# Patient Record
Sex: Female | Born: 1960
Health system: Southern US, Community
[De-identification: ages and names within clinical notes are randomized; demographics above are authoritative.]

## PROBLEM LIST (undated history)

## (undated) DIAGNOSIS — J449 Chronic obstructive pulmonary disease, unspecified: Secondary | ICD-10-CM

## (undated) DIAGNOSIS — J189 Pneumonia, unspecified organism: Secondary | ICD-10-CM

## (undated) HISTORY — PX: CHOLECYSTECTOMY: SHX55

## (undated) HISTORY — DX: Chronic obstructive pulmonary disease, unspecified: J44.9

---

## 2006-11-09 ENCOUNTER — Other Ambulatory Visit: Payer: Self-pay

## 2006-11-09 ENCOUNTER — Emergency Department: Payer: Self-pay | Admitting: Emergency Medicine

## 2013-02-13 ENCOUNTER — Ambulatory Visit: Payer: Self-pay | Admitting: Nurse Practitioner

## 2014-05-15 DIAGNOSIS — J189 Pneumonia, unspecified organism: Secondary | ICD-10-CM

## 2014-05-15 HISTORY — DX: Pneumonia, unspecified organism: J18.9

## 2014-06-01 ENCOUNTER — Ambulatory Visit: Payer: Self-pay | Admitting: Family Medicine

## 2014-06-08 ENCOUNTER — Ambulatory Visit: Payer: Self-pay | Admitting: Family Medicine

## 2014-08-13 ENCOUNTER — Ambulatory Visit
Admit: 2014-08-13 | Disposition: A | Discharge status: Home / Self Care | Payer: Self-pay | Attending: Family Medicine | Admitting: Family Medicine

## 2014-08-13 LAB — RAPID STREP-A WITH REFLX: Micro Text Report: NEGATIVE

## 2014-08-16 LAB — BETA STREP CULTURE(ARMC)

## 2015-04-23 ENCOUNTER — Emergency Department: Payer: BLUE CROSS/BLUE SHIELD

## 2015-04-23 ENCOUNTER — Encounter: Payer: Self-pay | Admitting: Emergency Medicine

## 2015-04-23 ENCOUNTER — Emergency Department
Admission: EM | Admit: 2015-04-23 | Discharge: 2015-04-23 | Disposition: A | Discharge status: Home / Self Care | Payer: BLUE CROSS/BLUE SHIELD | Attending: Emergency Medicine | Admitting: Emergency Medicine

## 2015-04-23 DIAGNOSIS — R1011 Right upper quadrant pain: Secondary | ICD-10-CM

## 2015-04-23 DIAGNOSIS — K8020 Calculus of gallbladder without cholecystitis without obstruction: Secondary | ICD-10-CM | POA: Diagnosis not present

## 2015-04-23 DIAGNOSIS — R109 Unspecified abdominal pain: Secondary | ICD-10-CM

## 2015-04-23 DIAGNOSIS — R1013 Epigastric pain: Secondary | ICD-10-CM | POA: Diagnosis present

## 2015-04-23 DIAGNOSIS — N390 Urinary tract infection, site not specified: Secondary | ICD-10-CM | POA: Diagnosis not present

## 2015-04-23 LAB — URINALYSIS COMPLETE WITH MICROSCOPIC (ARMC ONLY)
BILIRUBIN URINE: NEGATIVE
GLUCOSE, UA: NEGATIVE mg/dL
Ketones, ur: NEGATIVE mg/dL
NITRITE: NEGATIVE
Protein, ur: NEGATIVE mg/dL
Specific Gravity, Urine: 1.005 (ref 1.005–1.030)
pH: 8 (ref 5.0–8.0)

## 2015-04-23 LAB — COMPREHENSIVE METABOLIC PANEL
ALBUMIN: 4.3 g/dL (ref 3.5–5.0)
ALT: 26 U/L (ref 14–54)
AST: 22 U/L (ref 15–41)
Alkaline Phosphatase: 50 U/L (ref 38–126)
Anion gap: 7 (ref 5–15)
BILIRUBIN TOTAL: 0.4 mg/dL (ref 0.3–1.2)
BUN: 12 mg/dL (ref 6–20)
CO2: 33 mmol/L — ABNORMAL HIGH (ref 22–32)
Calcium: 9.8 mg/dL (ref 8.9–10.3)
Chloride: 102 mmol/L (ref 101–111)
Creatinine, Ser: 0.62 mg/dL (ref 0.44–1.00)
GFR calc Af Amer: >60 mL/min (ref >60)
GFR calc non Af Amer: >60 mL/min (ref >60)
GLUCOSE: 92 mg/dL (ref 65–99)
POTASSIUM: 4 mmol/L (ref 3.5–5.1)
Sodium: 142 mmol/L (ref 135–145)
Total Protein: 8.2 g/dL — ABNORMAL HIGH (ref 6.5–8.1)

## 2015-04-23 LAB — CBC
HEMATOCRIT: 41.6 % (ref 35.0–47.0)
Hemoglobin: 13.6 g/dL (ref 12.0–16.0)
MCH: 29.8 pg (ref 26.0–34.0)
MCHC: 32.7 g/dL (ref 32.0–36.0)
MCV: 91.2 fL (ref 80.0–100.0)
Platelets: 330 10*3/uL (ref 150–440)
RBC: 4.56 MIL/uL (ref 3.80–5.20)
RDW: 14.4 % (ref 11.5–14.5)
WBC: 8.1 10*3/uL (ref 3.6–11.0)

## 2015-04-23 LAB — TROPONIN I
Troponin I: <0.03 ng/mL (ref <0.031)
Troponin I: <0.03 ng/mL (ref <0.031)

## 2015-04-23 LAB — LIPASE, BLOOD: Lipase: 44 U/L (ref 11–51)

## 2015-04-23 MED ORDER — IOHEXOL 350 MG/ML SOLN
100.0000 mL | Freq: Once | INTRAVENOUS | Status: AC | PRN
  Administered 2015-04-23: 100 mL via INTRAVENOUS

## 2015-04-23 MED ORDER — FAMOTIDINE 20 MG PO TABS
40.0000 mg | ORAL_TABLET | Freq: Once | ORAL | Status: AC
  Administered 2015-04-23: 40 mg via ORAL
  Filled 2015-04-23: qty 2

## 2015-04-23 MED ORDER — MORPHINE SULFATE (PF) 4 MG/ML IV SOLN
4.0000 mg | Freq: Once | INTRAVENOUS | Status: AC
  Administered 2015-04-23: 4 mg via INTRAVENOUS
  Filled 2015-04-23: qty 1

## 2015-04-23 MED ORDER — ONDANSETRON HCL 4 MG/2ML IJ SOLN
4.0000 mg | Freq: Once | INTRAMUSCULAR | Status: AC
  Administered 2015-04-23: 4 mg via INTRAVENOUS
  Filled 2015-04-23: qty 2

## 2015-04-23 MED ORDER — CEPHALEXIN 500 MG PO CAPS
500.0000 mg | ORAL_CAPSULE | Freq: Once | ORAL | Status: AC
Start: 2015-04-23 — End: 2015-04-23
  Administered 2015-04-23: 500 mg via ORAL
  Filled 2015-04-23: qty 1

## 2015-04-23 MED ORDER — SODIUM CHLORIDE 0.9 % IV BOLUS (SEPSIS)
1000.0000 mL | Freq: Once | INTRAVENOUS | Status: AC
  Administered 2015-04-23: 1000 mL via INTRAVENOUS

## 2015-04-23 MED ORDER — FAMOTIDINE 40 MG PO TABS: 40.0000 mg | ORAL_TABLET | Freq: Every evening | ORAL | Status: DC

## 2015-04-23 MED ORDER — OXYCODONE-ACETAMINOPHEN 5-325 MG PO TABS
1.0000 | ORAL_TABLET | Freq: Once | ORAL | Status: AC
  Administered 2015-04-23: 1 via ORAL
  Filled 2015-04-23: qty 1

## 2015-04-23 MED ORDER — OXYCODONE-ACETAMINOPHEN 5-325 MG PO TABS: 1.0000 | ORAL_TABLET | Freq: Four times a day (QID) | ORAL | Status: DC | PRN

## 2015-04-23 MED ORDER — ONDANSETRON HCL 4 MG PO TABS: 4.0000 mg | ORAL_TABLET | Freq: Every day | ORAL | Status: DC | PRN

## 2015-04-23 MED ORDER — GI COCKTAIL ~~LOC~~
30.0000 mL | Freq: Once | ORAL | Status: AC
  Administered 2015-04-23: 30 mL via ORAL
  Filled 2015-04-23: qty 30

## 2015-04-23 MED ORDER — CEPHALEXIN 500 MG PO CAPS: 500.0000 mg | ORAL_CAPSULE | Freq: Four times a day (QID) | ORAL | Status: AC

## 2015-04-23 NOTE — ED Notes (Signed)
Pt w/ complaints of being dizzy/light headed upon ambulation to toilet.

## 2015-04-23 NOTE — ED Notes (Addendum)
Patient transported to Ultrasound 

## 2015-04-23 NOTE — ED Provider Notes (Signed)
[facility] Emergency Department Provider Note  ____________________________________________  Time seen: Approximately 330 PM  I have reviewed the triage vital signs and the nursing notes.   HISTORY  Chief Complaint Abdominal Pain    HPI Wanda Goodman is a 54 y.o. female with a history of a C-section who is presenting today with 2 weeks of increasing epigastric abdominal pain. She says that today the pain began radiating to her back and this was came in the emergency department. She says there are no worsening factors including food or deep breathing. She says that she has had nausea but no vomiting. Has a family history of gallbladder issues. No diarrhea. No pain with urination. Says the pain has been on and off. It is moderate to severe at this time.    History reviewed. No pertinent past medical history.  There are no active problems to display for this patient.   Past Surgical History  Procedure Laterality Date  . Cesarean section      No current outpatient prescriptions on file.  Allergies Review of patient's allergies indicates no known allergies.  No family history on file.  Social History Social History  Substance Use Topics  . Smoking status: Never Smoker   . Smokeless tobacco: None  . Alcohol Use: No    Review of Systems Constitutional: No fever/chills Eyes: No visual changes. ENT: No sore throat. Cardiovascular: Denies chest pain. Respiratory: Denies shortness of breath. Gastrointestinal:no vomiting.  No diarrhea.  No constipation. Genitourinary: Negative for dysuria. Musculoskeletal: Negative for back pain. Skin: Negative for rash. Neurological: Negative for headaches, focal weakness or numbness.  10-point ROS otherwise negative.  ____________________________________________   PHYSICAL EXAM:  VITAL SIGNS: ED Triage Vitals  Enc Vitals Group     BP 04/23/15 1350 139/82 mmHg     Pulse Rate 04/23/15 1350 78      Resp 04/23/15 1350 18     Temp 04/23/15 1350 98 F (36.7 C)     Temp Source 04/23/15 1350 Oral     SpO2 04/23/15 1350 98 %     Weight 04/23/15 1350 172 lb (78.019 kg)     Height 04/23/15 1350 5' (1.524 m)     Head Cir --      Peak Flow --      Pain Score 04/23/15 1406 3     Pain Loc --      Pain Edu? --      Excl. in [place]? --     Constitutional: Alert and oriented. Appears uncomfortable.  Eyes: Conjunctivae are normal. PERRL. EOMI. Head: Atraumatic. Nose: No congestion/rhinnorhea. Mouth/Throat: Mucous membranes are moist.  Oropharynx non-erythematous. Neck: No stridor.   Cardiovascular: Normal rate, regular rhythm. Grossly normal heart sounds.  Good peripheral circulation. Respiratory: Normal respiratory effort.  No retractions. Lungs CTAB. Gastrointestinal: Soft with moderate epigastric as well as right upper quadrant tenderness palpation. There is a negative Murphy sign. No distention. No abdominal bruits. No CVA tenderness. Musculoskeletal: No lower extremity tenderness nor edema.  No joint effusions. Neurologic:  Normal speech and language. No gross focal neurologic deficits are appreciated. No gait instability. Skin:  Skin is warm, dry and intact. No rash noted. Psychiatric: Mood and affect are normal. Speech and behavior are normal.  ____________________________________________   LABS (all labs ordered are listed, but only abnormal results are displayed)  Labs Reviewed  COMPREHENSIVE METABOLIC PANEL - Abnormal; Notable for the following:    CO2 33 (*)    Total Protein 8.2 (*)  All other components within normal limits  URINALYSIS COMPLETEWITH MICROSCOPIC ([facility] ONLY) - Abnormal; Notable for the following:    Color, Urine STRAW (*)    APPearance HAZY (*)    Hgb urine dipstick 1+ (*)    Leukocytes, UA 3+ (*)    Bacteria, UA RARE (*)    Squamous Epithelial / LPF 6-30 (*)    All other components within normal limits  URINE CULTURE  LIPASE, BLOOD  CBC  TROPONIN I   TROPONIN I   ____________________________________________  EKG  ED ECG REPORT I, Doran Stabler, the attending physician, personally viewed and interpreted this ECG.   Date: 04/23/2015  EKG Time: 1417  Rate: 74  Rhythm: normal EKG, normal sinus rhythm  Axis: Normal  Intervals:none  ST&T Change: No ST elevations or depressions. No abnormal T-wave inversions.  ____________________________________________  RADIOLOGY  No acute findings on the CT angiography. I discussed the case with Dr. Purcell Nails who says that the stenosis is likely anatomical and unlikely causing any symptoms at this time. Ultrasound does show cholelithiasis. ____________________________________________   PROCEDURES    ____________________________________________   INITIAL IMPRESSION / ASSESSMENT AND PLAN / ED COURSE  Pertinent labs & imaging results that were available during my care of the patient were reviewed by me and considered in my medical decision making (see chart for details).  ----------------------------------------- 8:59 PM on 04/23/2015 -----------------------------------------  Patient with pain subsided after pain meds as well as antacids. Reviewed lab workup as well as imaging results. The patient is aware of her cholelithiasis. Due to her pain now being under control as well as reassuring labs and vital signs she will be able to be discharged home. I will discharge her with by mouth medications. She will follow-up in the office with surgeon. ____________________________________________   FINAL CLINICAL IMPRESSION(S) / ED DIAGNOSES  Final diagnoses:  RUQ abdominal pain  Abdominal pain   cholelithiasis. UTI.    Orbie Pyo, MD 04/23/15 2104

## 2015-04-23 NOTE — ED Notes (Signed)
Pt in w/ intermittent abdominal pain since last Monday; pt reports nausea, denies vomiting/diarrhea.  Pt states sometimes the pain is so bad that its difficult to breathe.  Pt A/Ox4, vitals WDL, no immediate distress at this time.

## 2015-04-23 NOTE — ED Notes (Signed)
Patient transported to CT 

## 2015-04-23 NOTE — ED Notes (Signed)
Brought over from [facility] , epigastric abd pain x1 week , non radiating, pain feels like twisting motion.

## 2015-04-25 LAB — URINE CULTURE

## 2015-05-05 ENCOUNTER — Encounter: Payer: Self-pay | Admitting: General Surgery

## 2015-05-06 ENCOUNTER — Encounter: Payer: Self-pay | Admitting: General Surgery

## 2015-05-06 ENCOUNTER — Ambulatory Visit (INDEPENDENT_AMBULATORY_CARE_PROVIDER_SITE_OTHER): Payer: BLUE CROSS/BLUE SHIELD | Admitting: General Surgery

## 2015-05-06 VITALS — BP 151/85 | HR 67 | Temp 98.7°F | Ht 60.0 in | Wt 174.0 lb

## 2015-05-06 DIAGNOSIS — K8020 Calculus of gallbladder without cholecystitis without obstruction: Secondary | ICD-10-CM | POA: Diagnosis not present

## 2015-05-06 HISTORY — DX: Calculus of gallbladder without cholecystitis without obstruction: K80.20

## 2015-05-06 NOTE — Patient Instructions (Signed)
If you have any questions or concerns, please give [place] a call. We will do your surgery on May 25, 2015.

## 2015-05-06 NOTE — Progress Notes (Signed)
Patient ID: Wanda Goodman, female   DOB: Dec 31, 1960, 54 y.o.   MRN: [id number]  CC: ABDOMINAL PAIN  HPI Wanda Goodman is a 54 y.o. female presents clinic for evaluation of abdominal pain. Patient states she's had multiple bouts of abdominal pain over the last 3 years. She's been seen in the emergency department for this 2 separate times at 2 separate locations during this time. She states that on each separate episode that led to the emergency room visits she had severe midepigastric and right upper quadrant pain that shot through to her back on the right side. She denies any associated nausea, vomiting, diarrhea, constipation. She has chronic shortness of breath and intermittent chest pain secondary to COPD. Patient states his most recent attack lasted for about 2 weeks prior to being seen in the emergency department but has subsided since. She is otherwise in her usual state of health without any other current complaints.  HPI  Past Medical History  Diagnosis Date  . COPD (chronic obstructive pulmonary disease) [facility])     Past Surgical History  Procedure Laterality Date  . Cesarean section      Family History  Problem Relation Age of Onset  . Hypertension Mother   . COPD Mother   . Heart disease Mother   . COPD Brother     Social History Social History  Substance Use Topics  . Smoking status: Never Smoker   . Smokeless tobacco: None  . Alcohol Use: No    No Known Allergies  Current Outpatient Prescriptions  Medication Sig Dispense Refill  . albuterol (PROAIR HFA) 108 (90 BASE) MCG/ACT inhaler Inhale 2 puffs into the lungs.    . ondansetron (ZOFRAN) 4 MG tablet Take 1 tablet (4 mg total) by mouth daily as needed for nausea or vomiting. 10 tablet 0  . oxyCODONE-acetaminophen (ROXICET) 5-325 MG tablet Take 1-2 tablets by mouth every 6 (six) hours as needed. 15 tablet 0   No current facility-administered medications for this visit.     Review of Systems A  multi-point review of systems was asked and was negative except for the findings documented in the history of present illness  Physical Exam Blood pressure 151/85, pulse 67, temperature 98.7 F (37.1 C), height 5' (1.524 m), weight 78.926 kg (174 lb). CONSTITUTIONAL: No acute distress. EYES: Pupils are equal, round, and reactive to light, Sclera are non-icteric. EARS, NOSE, MOUTH AND THROAT: The oropharynx is clear. The oral mucosa is pink and moist. Hearing is intact to voice. LYMPH NODES:  Lymph nodes in the neck are normal. RESPIRATORY:  Lungs are with coarse breath sounds in all quadrants. There is normal respiratory effort, with equal breath sounds bilaterally, and without pathologic use of accessory muscles. CARDIOVASCULAR: Heart is regular without murmurs, gallops, or rubs. GI: The abdomen is soft, nontender, and nondistended. There are no palpable masses. There is no hepatosplenomegaly. There are normal bowel sounds in all quadrants. GU: Rectal deferred.   MUSCULOSKELETAL: Normal muscle strength and tone. No cyanosis or edema.   SKIN: Turgor is good and there are no pathologic skin lesions or ulcers. NEUROLOGIC: Motor and sensation is grossly normal. Cranial nerves are grossly intact. PSYCH:  Oriented to person, place and time. Affect is normal.  Data Reviewed Images and labs reviewed. Ultrasound shows evidence of cholelithiasis without cholecystitis. All labs are within normal limits. I have personally reviewed the patient's imaging, laboratory findings and medical records.    Assessment    Biliary colic  Plan    Discussed in detail with the patient that her symptoms and history are not 100% classic for biliary colic. Patient states that numerous members of her family have the exact same problem that was all related to their gallbladders. Discussed that this could also be from her stomach and that removing her gallbladder would not fix this patient voiced understanding and  still wishes to proceed with a laparoscopic cholecystectomy.  I discussed the procedure in detail.  The patient was given Neurosurgeon.  We discussed the risks and benefits of a laparoscopic cholecystectomy and possible cholangiogram including, but not limited to bleeding, infection, injury to surrounding structures such as the intestine or liver, bile leak, retained gallstones, need to convert to an open procedure, prolonged diarrhea, blood clots such as  DVT, common bile duct injury, anesthesia risks, and possible need for additional procedures.  The likelihood of improvement in symptoms and return to the patient's normal status is good. We discussed the typical post-operative recovery course. Plan for laparoscopic cholecystectomy on 05/25/2015     Time spent with the patient was 45 minutes, with more than 50% of the time spent in face-to-face education, counseling and care coordination.     Clayburn Pert, MD FACS General Surgeon 05/06/2015, 10:25 AM

## 2015-05-12 ENCOUNTER — Telehealth: Payer: Self-pay | Admitting: General Surgery

## 2015-05-12 NOTE — Telephone Encounter (Signed)
Pt advised of pre op date/time and sx date. Sx: 05/25/15 with Dr Gabrielle Dare Cholecystectomy.  Pre op: 05/14/15 between 1-5:00pm--phone.

## 2015-05-14 ENCOUNTER — Encounter: Payer: Self-pay | Admitting: *Deleted

## 2015-05-14 ENCOUNTER — Other Ambulatory Visit: Payer: BLUE CROSS/BLUE SHIELD

## 2015-05-14 NOTE — Patient Instructions (Signed)
  Your procedure is scheduled on: 05-25-15 Report to [place] To find out your arrival time please call [phone] between 1PM - 3PM on 05-24-15  Remember: Instructions that are not followed completely may result in serious medical risk, up to and including death, or upon the discretion of your surgeon and anesthesiologist your surgery may need to be rescheduled.    _X___ 1. Do not eat food or drink liquids after midnight. No gum chewing or hard candies.     _X___ 2. No Alcohol for 24 hours before or after surgery.   ____ 3. Bring all medications with you on the day of surgery if instructed.    ____ 4. Notify your doctor if there is any change in your medical condition     (cold, fever, infections).     Do not wear jewelry, make-up, hairpins, clips or nail polish.  Do not wear lotions, powders, or perfumes. You may wear deodorant.  Do not shave 48 hours prior to surgery. Men may shave face and neck.  Do not bring valuables to the hospital.    [facility] is not responsible for any belongings or valuables.               Contacts, dentures or bridgework may not be worn into surgery.  Leave your suitcase in the car. After surgery it may be brought to your room.  For patients admitted to the hospital, discharge time is determined by your treatment team.   Patients discharged the day of surgery will not be allowed to drive home.   Please read over the following fact sheets that you were given:      ____ Take these medicines the morning of surgery with A SIP OF WATER:    1. NONE  2.   3.   4.  5.  6.  ____ Fleet Enema (as directed)   ____ Use CHG Soap as directed  _X___ Use inhalers on the day of surgery-USE ALBUTEROL INHALER AT [place]  ____ Stop metformin 2 days prior to surgery    ____ Take 1/2 of usual insulin dose the night before surgery and none on the morning of surgery.   ____ Stop  Coumadin/Plavix/aspirin-N/A  ____ Stop Anti-inflammatories-NO NSAIDS OR ASA PRODCUTS-PERCOCET OK TO CONTINUE/TYLENOL OK   ____ Stop supplements until after surgery.    ____ Bring C-Pap to the hospital.

## 2015-05-18 MED ORDER — ETOMIDATE 2 MG/ML IV SOLN
INTRAVENOUS | Status: AC
  Filled 2015-05-18: qty 10

## 2015-05-19 ENCOUNTER — Telehealth: Payer: Self-pay | Admitting: General Surgery

## 2015-05-19 NOTE — Telephone Encounter (Signed)
No authorization is required for CPT: 47562--Laparoscopic cholecystectomy for Date of Service: 05/25/15 per Gaynelle Arabian with [employer].

## 2015-05-25 ENCOUNTER — Ambulatory Visit: Payer: BLUE CROSS/BLUE SHIELD | Admitting: Certified Registered Nurse Anesthetist

## 2015-05-25 ENCOUNTER — Ambulatory Visit
Admission: RE | Admit: 2015-05-25 | Discharge: 2015-05-25 | Disposition: A | Discharge status: Home / Self Care | Payer: BLUE CROSS/BLUE SHIELD | Source: Ambulatory Visit | Attending: General Surgery | Admitting: General Surgery

## 2015-05-25 ENCOUNTER — Encounter: Payer: Self-pay | Admitting: *Deleted

## 2015-05-25 ENCOUNTER — Encounter
Admission: RE | Disposition: A | Discharge status: Home / Self Care | Payer: Self-pay | Source: Ambulatory Visit | Attending: General Surgery

## 2015-05-25 DIAGNOSIS — K8080 Other cholelithiasis without obstruction: Secondary | ICD-10-CM | POA: Diagnosis not present

## 2015-05-25 DIAGNOSIS — J449 Chronic obstructive pulmonary disease, unspecified: Secondary | ICD-10-CM | POA: Insufficient documentation

## 2015-05-25 DIAGNOSIS — K8010 Calculus of gallbladder with chronic cholecystitis without obstruction: Secondary | ICD-10-CM | POA: Insufficient documentation

## 2015-05-25 DIAGNOSIS — K8020 Calculus of gallbladder without cholecystitis without obstruction: Secondary | ICD-10-CM | POA: Diagnosis present

## 2015-05-25 DIAGNOSIS — Z79899 Other long term (current) drug therapy: Secondary | ICD-10-CM | POA: Diagnosis not present

## 2015-05-25 DIAGNOSIS — Z01810 Encounter for preprocedural cardiovascular examination: Secondary | ICD-10-CM

## 2015-05-25 HISTORY — DX: Pneumonia, unspecified organism: J18.9

## 2015-05-25 HISTORY — PX: CHOLECYSTECTOMY: SHX55

## 2015-05-25 SURGERY — LAPAROSCOPIC CHOLECYSTECTOMY
Anesthesia: General

## 2015-05-25 MED ORDER — NEOSTIGMINE METHYLSULFATE 10 MG/10ML IV SOLN
INTRAVENOUS | Status: DC | PRN
  Administered 2015-05-25: 3 mg via INTRAVENOUS

## 2015-05-25 MED ORDER — SODIUM CHLORIDE 0.9 % IJ SOLN
INTRAMUSCULAR | Status: AC
  Filled 2015-05-25: qty 10

## 2015-05-25 MED ORDER — DEXAMETHASONE SODIUM PHOSPHATE 10 MG/ML IJ SOLN
INTRAMUSCULAR | Status: DC | PRN
  Administered 2015-05-25: 4 mg via INTRAVENOUS

## 2015-05-25 MED ORDER — ACETAMINOPHEN 10 MG/ML IV SOLN
INTRAVENOUS | Status: AC
  Filled 2015-05-25: qty 100

## 2015-05-25 MED ORDER — FENTANYL CITRATE (PF) 100 MCG/2ML IJ SOLN
INTRAMUSCULAR | Status: DC | PRN
  Administered 2015-05-25: 100 ug via INTRAVENOUS

## 2015-05-25 MED ORDER — LIDOCAINE HCL (CARDIAC) 20 MG/ML IV SOLN
INTRAVENOUS | Status: DC | PRN
  Administered 2015-05-25: 100 mg via INTRAVENOUS

## 2015-05-25 MED ORDER — PROPOFOL 10 MG/ML IV BOLUS
INTRAVENOUS | Status: DC | PRN
  Administered 2015-05-25: 200 mg via INTRAVENOUS

## 2015-05-25 MED ORDER — BUPIVACAINE HCL (PF) 0.5 % IJ SOLN
INTRAMUSCULAR | Status: AC
  Filled 2015-05-25: qty 30

## 2015-05-25 MED ORDER — CHLORHEXIDINE GLUCONATE 4 % EX LIQD: 1.0000 "application " | Freq: Once | CUTANEOUS | Status: DC

## 2015-05-25 MED ORDER — FENTANYL CITRATE (PF) 100 MCG/2ML IJ SOLN
25.0000 ug | INTRAMUSCULAR | Status: DC | PRN
  Administered 2015-05-25 (×3): 25 ug via INTRAVENOUS

## 2015-05-25 MED ORDER — BUPIVACAINE HCL 0.5 % IJ SOLN
INTRAMUSCULAR | Status: DC | PRN
  Administered 2015-05-25: 9 mL

## 2015-05-25 MED ORDER — PROMETHAZINE HCL 25 MG/ML IJ SOLN
6.2500 mg | INTRAMUSCULAR | Status: AC | PRN
  Administered 2015-05-25 (×2): 6.25 mg via INTRAVENOUS

## 2015-05-25 MED ORDER — PROMETHAZINE HCL 25 MG/ML IJ SOLN
INTRAMUSCULAR | Status: AC
  Administered 2015-05-25: 6.25 mg via INTRAVENOUS
  Filled 2015-05-25: qty 1

## 2015-05-25 MED ORDER — ONDANSETRON HCL 4 MG/2ML IJ SOLN
INTRAMUSCULAR | Status: AC
  Filled 2015-05-25: qty 2

## 2015-05-25 MED ORDER — SUCCINYLCHOLINE CHLORIDE 20 MG/ML IJ SOLN
INTRAMUSCULAR | Status: DC | PRN
  Administered 2015-05-25: 140 mg via INTRAVENOUS

## 2015-05-25 MED ORDER — GLYCOPYRROLATE 0.2 MG/ML IJ SOLN
INTRAMUSCULAR | Status: DC | PRN
  Administered 2015-05-25: 0.4 mg via INTRAVENOUS

## 2015-05-25 MED ORDER — LIDOCAINE HCL (PF) 1 % IJ SOLN
INTRAMUSCULAR | Status: DC | PRN
  Administered 2015-05-25: 9 mL

## 2015-05-25 MED ORDER — LACTATED RINGERS IV SOLN
INTRAVENOUS | Status: DC
  Administered 2015-05-25: 10:00:00 via INTRAVENOUS

## 2015-05-25 MED ORDER — FAMOTIDINE 20 MG PO TABS
20.0000 mg | ORAL_TABLET | Freq: Once | ORAL | Status: AC
  Administered 2015-05-25: 20 mg via ORAL

## 2015-05-25 MED ORDER — PHENYLEPHRINE HCL 10 MG/ML IJ SOLN
INTRAMUSCULAR | Status: DC | PRN
  Administered 2015-05-25 (×3): 100 ug via INTRAVENOUS

## 2015-05-25 MED ORDER — FAMOTIDINE 20 MG PO TABS
ORAL_TABLET | ORAL | Status: AC
  Filled 2015-05-25: qty 1

## 2015-05-25 MED ORDER — ONDANSETRON HCL 4 MG/2ML IJ SOLN
4.0000 mg | Freq: Once | INTRAMUSCULAR | Status: AC
  Administered 2015-05-25: 4 mg via INTRAVENOUS

## 2015-05-25 MED ORDER — MIDAZOLAM HCL 2 MG/2ML IJ SOLN
INTRAMUSCULAR | Status: DC | PRN
  Administered 2015-05-25: 2 mg via INTRAVENOUS

## 2015-05-25 MED ORDER — DEXTROSE 5 % IV SOLN
1.0000 g | Freq: Four times a day (QID) | INTRAVENOUS | Status: DC
  Administered 2015-05-25: 1 g via INTRAVENOUS
  Filled 2015-05-25: qty 1

## 2015-05-25 MED ORDER — LIDOCAINE HCL (PF) 1 % IJ SOLN
INTRAMUSCULAR | Status: AC
  Filled 2015-05-25: qty 30

## 2015-05-25 MED ORDER — OXYCODONE-ACETAMINOPHEN 5-325 MG PO TABS: 1.0000 | ORAL_TABLET | Freq: Four times a day (QID) | ORAL | Status: DC | PRN

## 2015-05-25 MED ORDER — ROCURONIUM BROMIDE 100 MG/10ML IV SOLN
INTRAVENOUS | Status: DC | PRN
  Administered 2015-05-25: 15 mg via INTRAVENOUS
  Administered 2015-05-25: 5 mg via INTRAVENOUS

## 2015-05-25 MED ORDER — FENTANYL CITRATE (PF) 100 MCG/2ML IJ SOLN
INTRAMUSCULAR | Status: AC
  Administered 2015-05-25: 25 ug via INTRAVENOUS
  Filled 2015-05-25: qty 2

## 2015-05-25 MED ORDER — ACETAMINOPHEN 10 MG/ML IV SOLN
INTRAVENOUS | Status: DC | PRN
  Administered 2015-05-25: 1000 mg via INTRAVENOUS

## 2015-05-25 SURGICAL SUPPLY — 49 items
ADHESIVE MASTISOL STRL (MISCELLANEOUS) IMPLANT
APPLIER CLIP ROT 10 11.4 M/L (STAPLE) ×3
BAG COUNTER SPONGE EZ (MISCELLANEOUS) ×2 IMPLANT
BENZOIN TINCTURE PRP APPL 2/3 (GAUZE/BANDAGES/DRESSINGS) ×3 IMPLANT
BLADE SURG SZ11 CARB STEEL (BLADE) ×3 IMPLANT
BULB RESERV EVAC DRAIN JP 100C (MISCELLANEOUS) IMPLANT
CANISTER SUCT 1200ML W/VALVE (MISCELLANEOUS) ×3 IMPLANT
CATH CHOLANG 76X19 KUMAR (CATHETERS) IMPLANT
CHLORAPREP W/TINT 26ML (MISCELLANEOUS) ×3 IMPLANT
CLIP APPLIE ROT 10 11.4 M/L (STAPLE) ×1 IMPLANT
CLOSURE WOUND 1/2 X4 (GAUZE/BANDAGES/DRESSINGS) ×1
CONRAY 60ML FOR OR (MISCELLANEOUS) IMPLANT
COUNTER SPONGE BAG EZ (MISCELLANEOUS) ×1
DISSECTOR KITTNER STICK (MISCELLANEOUS) IMPLANT
DISSECTORS/KITTNER STICK (MISCELLANEOUS)
DRAIN CHANNEL JP 19F (MISCELLANEOUS) IMPLANT
DRAPE SHEET LG 3/4 BI-LAMINATE (DRAPES) IMPLANT
DRSG TEGADERM 2-3/8X2-3/4 SM (GAUZE/BANDAGES/DRESSINGS) IMPLANT
DRSG TELFA 3X8 NADH (GAUZE/BANDAGES/DRESSINGS) IMPLANT
GAUZE SPONGE 4X4 12PLY STRL (GAUZE/BANDAGES/DRESSINGS) ×3 IMPLANT
GLOVE BIO SURGEON STRL SZ7.5 (GLOVE) ×15 IMPLANT
GLOVE INDICATOR 8.0 STRL GRN (GLOVE) ×3 IMPLANT
GOWN STRL REUS W/ TWL LRG LVL3 (GOWN DISPOSABLE) ×3 IMPLANT
GOWN STRL REUS W/TWL LRG LVL3 (GOWN DISPOSABLE) ×6
IRRIGATION STRYKERFLOW (MISCELLANEOUS) IMPLANT
IRRIGATOR STRYKERFLOW (MISCELLANEOUS)
IV NS 1000ML (IV SOLUTION)
IV NS 1000ML BAXH (IV SOLUTION) IMPLANT
L-HOOK LAP DISP 36CM (ELECTROSURGICAL) ×3
LABEL OR SOLS (LABEL) ×3 IMPLANT
LHOOK LAP DISP 36CM (ELECTROSURGICAL) ×1 IMPLANT
LIQUID BAND (GAUZE/BANDAGES/DRESSINGS) IMPLANT
NEEDLE HYPO 25X1 1.5 SAFETY (NEEDLE) ×3 IMPLANT
NEEDLE VERESS 14GA 120MM (NEEDLE) ×3 IMPLANT
NS IRRIG 500ML POUR BTL (IV SOLUTION) ×3 IMPLANT
PACK LAP CHOLECYSTECTOMY (MISCELLANEOUS) ×3 IMPLANT
PAD GROUND ADULT SPLIT (MISCELLANEOUS) ×3 IMPLANT
PENCIL ELECTRO HAND CTR (MISCELLANEOUS) ×3 IMPLANT
POUCH ENDO CATCH 10MM SPEC (MISCELLANEOUS) IMPLANT
SCISSORS METZENBAUM CVD 33 (INSTRUMENTS) ×3 IMPLANT
SLEEVE ENDOPATH XCEL 5M (ENDOMECHANICALS) ×6 IMPLANT
STRAP SAFETY BODY (MISCELLANEOUS) ×3 IMPLANT
STRIP CLOSURE SKIN 1/2X4 (GAUZE/BANDAGES/DRESSINGS) ×2 IMPLANT
SUT MNCRL 4-0 (SUTURE) ×2
SUT MNCRL 4-0 27XMFL (SUTURE) ×1
SUTURE MNCRL 4-0 27XMF (SUTURE) ×1 IMPLANT
TROCAR XCEL 12X100 BLDLESS (ENDOMECHANICALS) ×3 IMPLANT
TROCAR XCEL NON-BLD 5MMX100MML (ENDOMECHANICALS) ×3 IMPLANT
TUBING INSUFFLATOR HI FLOW (MISCELLANEOUS) ×3 IMPLANT

## 2015-05-25 NOTE — Op Note (Signed)
Laparoscopic Cholecystectomy  Pre-operative Diagnosis: Cholelithiasis  Post-operative Diagnosis: Same  Procedure: Laparoscopic cholecystectomy  Surgeon: Juanda Crumble T. Adonis Huguenin, MD FACS  Anesthesia: Gen. with endotracheal tube  Assistant: None  Procedure Details  The patient was seen again in the Holding Room. The benefits, complications, treatment options, and expected outcomes were discussed with the patient. The risks of bleeding, infection, recurrence of symptoms, failure to resolve symptoms, bile duct damage, bile duct leak, retained common bile duct stone, bowel injury, any of which could require further surgery and/or ERCP, stent, or papillotomy were reviewed with the patient. The likelihood of improving the patient's symptoms with return to their baseline status is good.  The patient and/or family concurred with the proposed plan, giving informed consent.  The patient was taken to Operating Room, identified as Wanda Goodman and the procedure verified as Laparoscopic Cholecystectomy.  A Time Out was held and the above information confirmed.  Prior to the induction of general anesthesia, antibiotic prophylaxis was administered. VTE prophylaxis was in place. General endotracheal anesthesia was then administered and tolerated well. After the induction, the abdomen was prepped with Chloraprep and draped in the sterile fashion. The patient was positioned in the supine position.  Local anesthetic  was injected into the skin near the umbilicus and an incision made. The Veress needle was placed. Pneumoperitoneum was then created with CO2 and tolerated well without any adverse changes in the patient's vital signs. A 16mm port was placed in the periumbilical position and the abdominal cavity was explored.  Two 5-mm ports were placed in the right upper quadrant and a 12 mm epigastric port was placed all under direct vision. All skin incisions  were infiltrated with a local anesthetic agent before making  the incision and placing the trocars.   The patient was positioned  in reverse Trendelenburg, tilted slightly to the patient's left.  The gallbladder was identified, the fundus grasped and retracted cephalad. Adhesions were lysed bluntly. The infundibulum was grasped and retracted laterally, exposing the peritoneum overlying the triangle of Calot. This was then divided and exposed in a blunt fashion. A critical view of the cystic duct and cystic artery was obtained.  The cystic duct was clearly identified and bluntly dissected.  The duct and artery were able to be clipped with 2 clips proximal psammomatous ago. These were cut in between the endoscopic shears.   The gallbladder was taken from the gallbladder fossa in a retrograde fashion with the electrocautery. The gallbladder was removed and placed in an Endocatch bag. The liver bed was inspected and hemostasis was achieved with the electrocautery. There was no evidence of any bile spillage or bleeding require irrigation resection. The gallbladder and Endocatch sac were then removed through the epigastric port site.   Inspection of the right upper quadrant was performed. No bleeding, bile duct injury or leak, or bowel injury was noted. Pneumoperitoneum was released. 4-0 subcuticular Monocryl was used to close the skin. Steristrips and Mastisol and sterile dressings were  applied.  The patient was then extubated and brought to the recovery room in stable condition. Sponge, lap, and needle counts were correct at closure and at the conclusion of the case.   Findings: Chronic Cholecystitis   Estimated Blood Loss: 5 ML's         Drains: None         Specimens: Gallbladder           Complications: none  Malcolm Quast T. Adonis Huguenin, MD, FACS

## 2015-05-25 NOTE — Transfer of Care (Signed)
Immediate Anesthesia Transfer of Care Note  Patient: Wanda Goodman  Procedure(s) Performed: Procedure(s): LAPAROSCOPIC CHOLECYSTECTOMY (N/A)  Patient Location: PACU  Anesthesia Type:General  Level of Consciousness: awake  Airway & Oxygen Therapy: Patient Spontanous Breathing and Patient connected to nasal cannula oxygen  Post-op Assessment: Report given to RN and Post -op Vital signs reviewed and stable  Post vital signs: Reviewed and stable  Last Vitals:  Filed Vitals:   05/25/15 0926 05/25/15 1144  BP: 171/86 122/81  Pulse: 75 86  Temp: 36.8 C 36.1 C  Resp: 16 22    Complications: No apparent anesthesia complications

## 2015-05-25 NOTE — Brief Op Note (Signed)
05/25/2015  11:30 AM  PATIENT:  Wanda Goodman  55 y.o. female  PRE-OPERATIVE DIAGNOSIS:  CHOLELITHIASIS  POST-OPERATIVE DIAGNOSIS:  same  PROCEDURE:  Procedure(s): LAPAROSCOPIC CHOLECYSTECTOMY (N/A)  SURGEON:  Surgeon(s) and Role:    * Clayburn Pert, MD - Primary  PHYSICIAN ASSISTANT:   ASSISTANTS: none   ANESTHESIA:   general  EBL:  Total I/O In: 700 [I.V.:700] Out: 5 [Blood:5]  BLOOD ADMINISTERED:none  DRAINS: none   LOCAL MEDICATIONS USED:  MARCAINE   , XYLOCAINE  and Amount: 20 ml  SPECIMEN:  Source of Specimen:  gallbladder  DISPOSITION OF SPECIMEN:  PATHOLOGY  COUNTS:  YES  TOURNIQUET:  * No tourniquets in log *  DICTATION: .Dragon Dictation  PLAN OF CARE: Discharge to home after PACU  PATIENT DISPOSITION:  PACU - hemodynamically stable.   Delay start of Pharmacological VTE agent (>24hrs) due to surgical blood loss or risk of bleeding: not applicable

## 2015-05-25 NOTE — Interval H&P Note (Signed)
History and Physical Interval Note:  05/25/2015 9:54 AM  Wanda Goodman  has presented today for surgery, with the diagnosis of CHOLELITHIASIS  The various methods of treatment have been discussed with the patient and family. After consideration of risks, benefits and other options for treatment, the patient has consented to  Procedure(s): LAPAROSCOPIC CHOLECYSTECTOMY (N/A) as a surgical intervention .  The patient's history has been reviewed, patient examined, no change in status, stable for surgery.  I have reviewed the patient's chart and labs.  Questions were answered to the patient's satisfaction.     Clayburn Pert

## 2015-05-25 NOTE — Discharge Instructions (Signed)
Laparoscopic Cholecystectomy, Care After Refer to this sheet in the next few weeks. These instructions provide you with information about caring for yourself after your procedure. Your health care provider may also give you more specific instructions. Your treatment has been planned according to current medical practices, but problems sometimes occur. Call your health care provider if you have any problems or questions after your procedure. WHAT TO EXPECT AFTER THE PROCEDURE After your procedure, it is common to have:  Pain at your incision sites. You will be given pain medicines to control your pain.  Mild nausea or vomiting. This should improve after the first 24 hours.  Bloating and possible shoulder pain from the gas that was used during the procedure. This will improve after the first 24 hours. HOME CARE INSTRUCTIONS Incision Care  Follow instructions from your health care provider about how to take care of your incisions. Make sure you:  Wash your hands with soap and water before you change your bandage (dressing). If soap and water are not available, use hand sanitizer.  Change your dressing as told by your health care provider.  Leave stitches (sutures), skin glue, or adhesive strips in place. These skin closures may need to be in place for 2 weeks or longer. If adhesive strip edges start to loosen and curl up, you may trim the loose edges. Do not remove adhesive strips completely unless your health care provider tells you to do that.  Do not take baths, swim, or use a hot tub until your health care provider approves. Ask your health care provider if you can take showers. You may only be allowed to take sponge baths for bathing. General Instructions  Take over-the-counter and prescription medicines only as told by your health care provider.  Do not drive or operate heavy machinery while taking prescription pain medicine.  Return to your normal diet as told by your health care  provider.  Do not lift anything that is heavier than 10 lb (4.5 kg).  Do not play contact sports for one week or until your health care provider approves. SEEK MEDICAL CARE IF:   You have redness, swelling, or pain at the site of your incision.  You have fluid, blood, or pus coming from your incision.  You notice a bad smell coming from your incision area.  Your surgical incisions break open.  You have a fever. SEEK IMMEDIATE MEDICAL CARE IF:  You develop a rash.  You have difficulty breathing.  You have chest pain.  You have increasing pain in your shoulders (shoulder strap areas).  You faint or have dizzy episodes while you are standing.  You have severe pain in your abdomen.  You have nausea or vomiting that lasts for more than one day.   This information is not intended to replace advice given to you by your health care provider. Make sure you discuss any questions you have with your health care provider.   General Anesthesia, Adult, Care After Refer to this sheet in the next few weeks. These instructions provide you with information on caring for yourself after your procedure. Your health care provider may also give you more specific instructions. Your treatment has been planned according to current medical practices, but problems sometimes occur. Call your health care provider if you have any problems or questions after your procedure. WHAT TO EXPECT AFTER THE PROCEDURE After the procedure, it is typical to experience:  Sleepiness.  Nausea and vomiting. HOME CARE INSTRUCTIONS  For the first 44  hours after general anesthesia:  Have a responsible person with you.  Do not drive a car. If you are alone, do not take public transportation.  Do not drink alcohol.  Do not take medicine that has not been prescribed by your health care provider.  Do not sign important papers or make important decisions.  You may resume a normal diet and activities as directed by  your health care provider.  Change bandages (dressings) as directed.  If you have questions or problems that seem related to general anesthesia, call the hospital and ask for the anesthetist or anesthesiologist on call. SEEK MEDICAL CARE IF:  You have nausea and vomiting that continue the day after anesthesia.  You develop a rash. SEEK IMMEDIATE MEDICAL CARE IF:   You have difficulty breathing.  You have chest pain.  You have any allergic problems.   This information is not intended to replace advice given to you by your health care provider. Make sure you discuss any questions you have with your health care provider.   Document Released: 08/07/2000 Document Revised: 05/22/2014 Document Reviewed: 08/30/2011 Elsevier Interactive Patient Education 2016 [place] Released: 05/01/2005 Document Revised: 01/20/2015 Document Reviewed: 12/11/2012 Elsevier Interactive Patient Education [employer].

## 2015-05-25 NOTE — Anesthesia Preprocedure Evaluation (Signed)
Anesthesia Evaluation  Patient identified by MRN, date of birth, ID band Patient awake    Reviewed: Allergy & Precautions, H&P , NPO status , Patient's Chart, lab work & pertinent test results, reviewed documented beta blocker date and time   History of Anesthesia Complications Negative for: history of anesthetic complications  Airway Mallampati: II  TM Distance: >3 FB Neck ROM: full    Dental no notable dental hx. (+) Teeth Intact   Pulmonary neg shortness of breath, neg sleep apnea, COPD, neg recent URI,    Pulmonary exam normal breath sounds clear to auscultation       Cardiovascular Exercise Tolerance: Good negative cardio ROS Normal cardiovascular exam Rhythm:regular Rate:Normal     Neuro/Psych negative neurological ROS  negative psych ROS   GI/Hepatic negative GI ROS, Neg liver ROS,   Endo/Other  negative endocrine ROS  Renal/GU negative Renal ROS  negative genitourinary   Musculoskeletal   Abdominal   Peds  Hematology negative hematology ROS (+)   Anesthesia Other Findings Past Medical History:   COPD (chronic obstructive pulmonary disease) (*              Pneumonia                                       05-2014       Reproductive/Obstetrics negative OB ROS                             Anesthesia Physical Anesthesia Plan  ASA: II  Anesthesia Plan: General   Post-op Pain Management:    Induction:   Airway Management Planned:   Additional Equipment:   Intra-op Plan:   Post-operative Plan:   Informed Consent: I have reviewed the patients History and Physical, chart, labs and discussed the procedure including the risks, benefits and alternatives for the proposed anesthesia with the patient or authorized representative who has indicated his/her understanding and acceptance.   Dental Advisory Given  Plan Discussed with: Anesthesiologist, CRNA and Surgeon  Anesthesia  Plan Comments:         Anesthesia Quick Evaluation

## 2015-05-25 NOTE — Anesthesia Postprocedure Evaluation (Signed)
Anesthesia Post Note  Patient: Wanda Goodman  Procedure(s) Performed: Procedure(s) (LRB): LAPAROSCOPIC CHOLECYSTECTOMY (N/A)  Patient location during evaluation: PACU Anesthesia Type: General Level of consciousness: awake and alert Pain management: pain level controlled Vital Signs Assessment: post-procedure vital signs reviewed and stable Respiratory status: spontaneous breathing, nonlabored ventilation, respiratory function stable and patient connected to nasal cannula oxygen Cardiovascular status: blood pressure returned to baseline and stable Postop Assessment: no signs of nausea or vomiting Anesthetic complications: no    Last Vitals:  Filed Vitals:   05/25/15 1344 05/25/15 1418  BP:  111/65  Pulse:  74  Temp: 37.3 C   Resp: 17 20    Last Pain:  Filed Vitals:   05/25/15 1422  PainSc: Asleep                 Martha Clan

## 2015-05-25 NOTE — Anesthesia Procedure Notes (Signed)
Procedure Name: Intubation Date/Time: 05/25/2015 10:43 AM Performed by: Johnna Acosta Pre-anesthesia Checklist: Patient identified, Emergency Drugs available, Suction available, Patient being monitored and Timeout performed Patient Re-evaluated:Patient Re-evaluated prior to inductionOxygen Delivery Method: Circle system utilized Preoxygenation: Pre-oxygenation with 100% oxygen Intubation Type: IV induction Ventilation: Mask ventilation without difficulty and Oral airway inserted - appropriate to patient size Laryngoscope Size: Sabra Heck and 2 Grade View: Grade I Tube type: Oral Tube size: 7.5 mm Number of attempts: 1 Airway Equipment and Method: Stylet Placement Confirmation: ETT inserted through vocal cords under direct vision,  positive ETCO2 and breath sounds checked- equal and bilateral Secured at: 21 cm Tube secured with: Tape Dental Injury: Teeth and Oropharynx as per pre-operative assessment

## 2015-05-25 NOTE — H&P (View-Only) (Signed)
Patient ID: Wanda Goodman, female   DOB: 10-28-1960, 55 y.o.   MRN: [id number]  CC: ABDOMINAL PAIN  HPI Wanda Goodman is a 55 y.o. female presents clinic for evaluation of abdominal pain. Patient states she's had multiple bouts of abdominal pain over the last 3 years. She's been seen in the emergency department for this 2 separate times at 2 separate locations during this time. She states that on each separate episode that led to the emergency room visits she had severe midepigastric and right upper quadrant pain that shot through to her back on the right side. She denies any associated nausea, vomiting, diarrhea, constipation. She has chronic shortness of breath and intermittent chest pain secondary to COPD. Patient states his most recent attack lasted for about 2 weeks prior to being seen in the emergency department but has subsided since. She is otherwise in her usual state of health without any other current complaints.  HPI  Past Medical History  Diagnosis Date  . COPD (chronic obstructive pulmonary disease) [facility])     Past Surgical History  Procedure Laterality Date  . Cesarean section      Family History  Problem Relation Age of Onset  . Hypertension Mother   . COPD Mother   . Heart disease Mother   . COPD Brother     Social History Social History  Substance Use Topics  . Smoking status: Never Smoker   . Smokeless tobacco: None  . Alcohol Use: No    No Known Allergies  Current Outpatient Prescriptions  Medication Sig Dispense Refill  . albuterol (PROAIR HFA) 108 (90 BASE) MCG/ACT inhaler Inhale 2 puffs into the lungs.    . ondansetron (ZOFRAN) 4 MG tablet Take 1 tablet (4 mg total) by mouth daily as needed for nausea or vomiting. 10 tablet 0  . oxyCODONE-acetaminophen (ROXICET) 5-325 MG tablet Take 1-2 tablets by mouth every 6 (six) hours as needed. 15 tablet 0   No current facility-administered medications for this visit.     Review of Systems A  multi-point review of systems was asked and was negative except for the findings documented in the history of present illness  Physical Exam Blood pressure 151/85, pulse 67, temperature 98.7 F (37.1 C), height 5' (1.524 m), weight 78.926 kg (174 lb). CONSTITUTIONAL: No acute distress. EYES: Pupils are equal, round, and reactive to light, Sclera are non-icteric. EARS, NOSE, MOUTH AND THROAT: The oropharynx is clear. The oral mucosa is pink and moist. Hearing is intact to voice. LYMPH NODES:  Lymph nodes in the neck are normal. RESPIRATORY:  Lungs are with coarse breath sounds in all quadrants. There is normal respiratory effort, with equal breath sounds bilaterally, and without pathologic use of accessory muscles. CARDIOVASCULAR: Heart is regular without murmurs, gallops, or rubs. GI: The abdomen is soft, nontender, and nondistended. There are no palpable masses. There is no hepatosplenomegaly. There are normal bowel sounds in all quadrants. GU: Rectal deferred.   MUSCULOSKELETAL: Normal muscle strength and tone. No cyanosis or edema.   SKIN: Turgor is good and there are no pathologic skin lesions or ulcers. NEUROLOGIC: Motor and sensation is grossly normal. Cranial nerves are grossly intact. PSYCH:  Oriented to person, place and time. Affect is normal.  Data Reviewed Images and labs reviewed. Ultrasound shows evidence of cholelithiasis without cholecystitis. All labs are within normal limits. I have personally reviewed the patient's imaging, laboratory findings and medical records.    Assessment    Biliary colic  Plan    Discussed in detail with the patient that her symptoms and history are not 100% classic for biliary colic. Patient states that numerous members of her family have the exact same problem that was all related to their gallbladders. Discussed that this could also be from her stomach and that removing her gallbladder would not fix this patient voiced understanding and  still wishes to proceed with a laparoscopic cholecystectomy.  I discussed the procedure in detail.  The patient was given Neurosurgeon.  We discussed the risks and benefits of a laparoscopic cholecystectomy and possible cholangiogram including, but not limited to bleeding, infection, injury to surrounding structures such as the intestine or liver, bile leak, retained gallstones, need to convert to an open procedure, prolonged diarrhea, blood clots such as  DVT, common bile duct injury, anesthesia risks, and possible need for additional procedures.  The likelihood of improvement in symptoms and return to the patient's normal status is good. We discussed the typical post-operative recovery course. Plan for laparoscopic cholecystectomy on 05/25/2015     Time spent with the patient was 45 minutes, with more than 50% of the time spent in face-to-face education, counseling and care coordination.     Clayburn Pert, MD FACS General Surgeon 05/06/2015, 10:25 AM

## 2015-05-26 LAB — SURGICAL PATHOLOGY

## 2015-05-27 ENCOUNTER — Telehealth: Payer: Self-pay

## 2015-05-27 NOTE — Telephone Encounter (Signed)
Post-discharge call made to patient at this time. No answer. Left voicemail for return phone call.  

## 2015-05-31 NOTE — Telephone Encounter (Signed)
Call made once again to patient. No answer. Unable to leave voicemail.

## 2015-06-01 ENCOUNTER — Telehealth: Payer: Self-pay

## 2015-06-01 NOTE — Telephone Encounter (Signed)
Called patient and told her that her FMLA Forms were faxed.

## 2015-06-08 ENCOUNTER — Telehealth: Payer: Self-pay

## 2015-06-08 NOTE — Telephone Encounter (Signed)
FMLA Form have been filled and faxed.

## 2015-06-10 ENCOUNTER — Encounter: Payer: Self-pay | Admitting: General Surgery

## 2015-06-10 ENCOUNTER — Encounter (INDEPENDENT_AMBULATORY_CARE_PROVIDER_SITE_OTHER): Payer: Self-pay

## 2015-06-10 ENCOUNTER — Ambulatory Visit (INDEPENDENT_AMBULATORY_CARE_PROVIDER_SITE_OTHER): Payer: BLUE CROSS/BLUE SHIELD | Admitting: General Surgery

## 2015-06-10 VITALS — BP 149/91 | HR 76 | Temp 97.3°F | Wt 172.0 lb

## 2015-06-10 DIAGNOSIS — Z4889 Encounter for other specified surgical aftercare: Secondary | ICD-10-CM

## 2015-06-10 NOTE — Progress Notes (Signed)
Outpatient Surgical Follow Up  06/10/2015  Wanda Goodman is an 55 y.o. female.   Chief Complaint  Patient presents with  . Routine Post Op    Laparoscopic Cholecystectomy Dr. Adonis Huguenin 05/25/2015    HPI:  55 year old female status post laparoscopic cholecystectomy returns to clinic for follow-up. She denies any pain. She's been doing well, eating regularly and having normal bowel function. She denies any fevers, chills, nausea, vomiting, chest pain, shortness of breath, diarrhea, constipation. She's been very happy with her surgical results.  Past Medical History  Diagnosis Date  . COPD (chronic obstructive pulmonary disease) ([place])   . Pneumonia 05-2014    Past Surgical History  Procedure Laterality Date  . Cesarean section    . Cholecystectomy N/A 05/25/2015    Procedure: LAPAROSCOPIC CHOLECYSTECTOMY;  Surgeon: Clayburn Pert, MD;  Location: [facility] ORS;  Service: General;  Laterality: N/A;    Family History  Problem Relation Age of Onset  . Hypertension Mother   . COPD Mother   . Heart disease Mother   . COPD Brother     Social History:  reports that she has never smoked. She does not have any smokeless tobacco history on file. She reports that she does not drink alcohol or use illicit drugs.  Allergies: No Known Allergies  Medications reviewed.    ROS  a multipoint review of systems was completed, all pertinent positives and negatives were documented within the history of present illness and the remainder negative.   BP 149/91 mmHg  Pulse 76  Temp(Src) 97.3 F (36.3 C) (Oral)  Wt 78.019 kg (172 lb)  LMP 05/13/2013  Physical Exam   Gen.: No acute distress Chest: Clear to auscultation Heart: Regular rhythm Abdomen: Soft, nontender, nondistended. Well approximated laparoscopic cholecystectomy incision sites without any evidence of erythema or drainage.   No results found for this or any previous visit (from the past 48 hour(s)). No results  found.  Assessment/Plan:  1. Aftercare following surgery  55 year old female status post laparoscopic cholecystectomy. Pathology reviewed with the patient. Discussed signs and symptoms of infection and return to clinic immediately should they occur. Otherwise patient will follow up with clinic on an as-needed basis.     Clayburn Pert, MD FACS General Surgeon  06/10/2015,3:45 PM

## 2015-06-10 NOTE — Patient Instructions (Signed)
Please give [place] a call if you have any questions or concerns.  If you start having some fever, redness, and/or drainage.  Gradually increase your mobility.  If you go to the beach, pool or lake, please apply extra sunscreen on your incision area for about a year.

## 2016-08-02 ENCOUNTER — Other Ambulatory Visit: Payer: Self-pay | Admitting: Unknown Physician Specialty

## 2016-08-02 ENCOUNTER — Other Ambulatory Visit: Payer: Self-pay | Admitting: Family Medicine

## 2016-08-02 DIAGNOSIS — Z1231 Encounter for screening mammogram for malignant neoplasm of breast: Secondary | ICD-10-CM

## 2017-03-13 ENCOUNTER — Emergency Department (HOSPITAL_COMMUNITY)
Admission: EM | Admit: 2017-03-13 | Discharge: 2017-03-13 | Disposition: A | Discharge status: Home / Self Care | Payer: BLUE CROSS/BLUE SHIELD | Attending: Emergency Medicine | Admitting: Emergency Medicine

## 2017-03-13 ENCOUNTER — Emergency Department (HOSPITAL_COMMUNITY): Payer: BLUE CROSS/BLUE SHIELD

## 2017-03-13 ENCOUNTER — Encounter (HOSPITAL_COMMUNITY): Payer: Self-pay

## 2017-03-13 DIAGNOSIS — N3000 Acute cystitis without hematuria: Secondary | ICD-10-CM

## 2017-03-13 DIAGNOSIS — R0600 Dyspnea, unspecified: Secondary | ICD-10-CM | POA: Diagnosis not present

## 2017-03-13 DIAGNOSIS — Z79899 Other long term (current) drug therapy: Secondary | ICD-10-CM | POA: Insufficient documentation

## 2017-03-13 DIAGNOSIS — J449 Chronic obstructive pulmonary disease, unspecified: Secondary | ICD-10-CM | POA: Diagnosis not present

## 2017-03-13 DIAGNOSIS — R42 Dizziness and giddiness: Secondary | ICD-10-CM | POA: Diagnosis present

## 2017-03-13 LAB — URINALYSIS, ROUTINE W REFLEX MICROSCOPIC
Bilirubin Urine: NEGATIVE
Glucose, UA: NEGATIVE mg/dL
HGB URINE DIPSTICK: NEGATIVE
Ketones, ur: NEGATIVE mg/dL
Nitrite: NEGATIVE
Protein, ur: NEGATIVE mg/dL
RBC / HPF: NONE SEEN RBC/hpf (ref 0–5)
SPECIFIC GRAVITY, URINE: 1.009 (ref 1.005–1.030)
pH: 6 (ref 5.0–8.0)

## 2017-03-13 LAB — BASIC METABOLIC PANEL
Anion gap: 9 (ref 5–15)
BUN: 16 mg/dL (ref 6–20)
CO2: 29 mmol/L (ref 22–32)
Calcium: 9.4 mg/dL (ref 8.9–10.3)
Chloride: 103 mmol/L (ref 101–111)
Creatinine, Ser: 0.64 mg/dL (ref 0.44–1.00)
GFR calc Af Amer: >60 mL/min (ref >60)
GLUCOSE: 101 mg/dL — AB (ref 65–99)
POTASSIUM: 4.3 mmol/L (ref 3.5–5.1)
Sodium: 141 mmol/L (ref 135–145)

## 2017-03-13 LAB — CBC
HEMATOCRIT: 39.7 % (ref 36.0–46.0)
Hemoglobin: 13.2 g/dL (ref 12.0–15.0)
MCH: 30.7 pg (ref 26.0–34.0)
MCHC: 33.2 g/dL (ref 30.0–36.0)
MCV: 92.3 fL (ref 78.0–100.0)
Platelets: 283 10*3/uL (ref 150–400)
RBC: 4.3 MIL/uL (ref 3.87–5.11)
RDW: 13.9 % (ref 11.5–15.5)
WBC: 12.8 10*3/uL — ABNORMAL HIGH (ref 4.0–10.5)

## 2017-03-13 LAB — I-STAT BETA HCG BLOOD, ED (MC, WL, AP ONLY)

## 2017-03-13 LAB — BRAIN NATRIURETIC PEPTIDE: B NATRIURETIC PEPTIDE 5: 28.2 pg/mL (ref 0.0–100.0)

## 2017-03-13 MED ORDER — MECLIZINE HCL 25 MG PO TABS: 25.0000 mg | ORAL_TABLET | Freq: Three times a day (TID) | ORAL | 0 refills | Status: DC | PRN

## 2017-03-13 MED ORDER — ONDANSETRON HCL 4 MG/2ML IJ SOLN
4.0000 mg | Freq: Once | INTRAMUSCULAR | Status: AC
  Administered 2017-03-13: 4 mg via INTRAVENOUS
  Filled 2017-03-13: qty 2

## 2017-03-13 MED ORDER — MECLIZINE HCL 25 MG PO TABS
25.0000 mg | ORAL_TABLET | Freq: Once | ORAL | Status: AC
  Administered 2017-03-13: 25 mg via ORAL
  Filled 2017-03-13: qty 1

## 2017-03-13 MED ORDER — FOSFOMYCIN TROMETHAMINE 3 G PO PACK
3.0000 g | PACK | Freq: Once | ORAL | Status: AC
  Administered 2017-03-13: 3 g via ORAL
  Filled 2017-03-13: qty 3

## 2017-03-13 MED ORDER — SODIUM CHLORIDE 0.9 % IV SOLN
INTRAVENOUS | Status: DC
  Administered 2017-03-13: 20:00:00 via INTRAVENOUS

## 2017-03-13 MED ORDER — SODIUM CHLORIDE 0.9 % IV BOLUS (SEPSIS)
1000.0000 mL | Freq: Once | INTRAVENOUS | Status: AC
  Administered 2017-03-13: 1000 mL via INTRAVENOUS

## 2017-03-13 NOTE — ED Provider Notes (Signed)
Medical screening examination/treatment/procedure(s) were conducted as a shared visit with non-physician practitioner(s) and myself.  I personally evaluated the patient during the encounter. Briefly, the patient is a 56 y.o. female who presents with approximately 1 month of intermittent dizziness and shortness of breath.  She endorses prior history of vertigo but these are different.  No focal deficits.  No vision changes.  No facial droop or slurred speech.  No headache.  She denies any chest pain, dyspnea on exertion, orthopnea.  Exam was nonfocal.  Workup without evidence of orthostasis.  Patient does report being symptomatic with movement.  She was noted to have a urinary tract infection.  Symptoms improved with IV fluids.  There is low suspicion for central process.  Given her improvement with IV fluids was felt that she was stable for discharge with close PCP follow-up.    EKG Interpretation  Date/Time:  Tuesday March 13 2017 13:20:37 EDT Ventricular Rate:  72 PR Interval:  156 QRS Duration: 84 QT Interval:  378 QTC Calculation: 413 R Axis:   87 Text Interpretation:  Normal sinus rhythm Normal ECG No significant change since last tracing Confirmed by Addison Lank [phone]) on 03/13/2017 6:57:57 PM           Cardama, Grayce Sessions, MD 03/13/17 2342

## 2017-03-13 NOTE — ED Provider Notes (Signed)
[place] Provider Note   CSN: [id number] Arrival date & time: 03/13/17  1304     History   Chief Complaint Chief Complaint  Patient presents with  . dizziness/SOB    HPI Wanda Goodman is a 56 y.o. female who presents emergency department chief complaint of intermittent dizziness and shortness of breath.  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  Has never been a smoker.  She states that she has had intermittent episodes of feeling off balance.  She denies vertigo.  She denies presyncope.  She has noticed episodes of racing or skipping in her heart however it is not always associated with it.  Patient has noticed intermittent headaches.  She is under significant stress with a new workplace, family issues.  She denies vision changes, difficulty with speech, slurring of words, facial droop, unilateral weakness, history of migraine headaches.  She denies black or tarry colored stools or hematochezia.  She has been eating and drinking normally.  She denies chest pain, orthopnea or PND.  HPI  Past Medical History:  Diagnosis Date  . COPD (chronic obstructive pulmonary disease) ([place])   . Pneumonia 05-2014    Patient Active Problem List   Diagnosis Date Noted  . Cholelithiasis without cholecystitis 05/06/2015    Past Surgical History:  Procedure Laterality Date  . CESAREAN SECTION    . CHOLECYSTECTOMY N/A 05/25/2015   Procedure: LAPAROSCOPIC CHOLECYSTECTOMY;  Surgeon: Clayburn Pert, MD;  Location: [facility] ORS;  Service: General;  Laterality: N/A;    OB History    No data available       Home Medications    Prior to Admission medications   Medication Sig Start Date End Date Taking? Authorizing Provider  acetaminophen (TYLENOL) 500 MG tablet Take 1,000 mg by mouth every 4 (four) hours as needed for moderate pain.   Yes [provider]  albuterol (PROAIR HFA) 108 (90 BASE) MCG/ACT inhaler Inhale 2 puffs into the  lungs. 03/29/15   [provider]    Family History Family History  Problem Relation Age of Onset  . Hypertension Mother   . COPD Mother   . Heart disease Mother   . COPD Brother     Social History Social History  Substance Use Topics  . Smoking status: Never Smoker  . Smokeless tobacco: Never Used  . Alcohol use No     Allergies   Patient has no known allergies.   Review of Systems Review of Systems  Ten systems reviewed and are negative for acute change, except as noted in the HPI.   Physical Exam Updated Vital Signs BP 139/87 (BP Location: Left Arm)   Pulse 78   Temp 98.4 F (36.9 C) (Oral)   Resp 13   Ht 5' (1.524 m)   Wt 68 kg (150 lb)   SpO2 99%   BMI 29.29 kg/m   Physical Exam  Constitutional: She is oriented to person, place, and time. She appears well-developed and well-nourished. No distress.  HENT:  Head: Normocephalic and atraumatic.  Eyes: Conjunctivae are normal. No scleral icterus.  Neck: Normal range of motion.  Cardiovascular: Normal rate, regular rhythm and normal heart sounds.  Exam reveals no gallop and no friction rub.   No murmur heard. Pulmonary/Chest: Effort normal and breath sounds normal. No respiratory distress.  Abdominal: Soft. Bowel sounds are normal. She exhibits no distension and no mass. There is no tenderness. There is no guarding.  Neurological: She is alert  and oriented to person, place, and time.  Speech is clear and goal oriented, follows commands Major Cranial nerves without deficit, no facial droop Normal strength in upper and lower extremities bilaterally including dorsiflexion and plantar flexion, strong and equal grip strength Sensation normal to light and sharp touch Moves extremities without ataxia, coordination intact Normal finger to nose and rapid alternating movements Neg romberg, no pronator drift Normal gait, but unsteady, self corrects  Normal head impulse test, normal test of skew, no  nystagmus   Skin: Skin is warm and dry. She is not diaphoretic.  Psychiatric: Her behavior is normal.  Nursing note and vitals reviewed.    ED Treatments / Results  Labs (all labs ordered are listed, but only abnormal results are displayed) Labs Reviewed  BASIC METABOLIC PANEL - Abnormal; Notable for the following:       Result Value   Glucose, Bld 101 (*)    All other components within normal limits  CBC - Abnormal; Notable for the following:    WBC 12.8 (*)    All other components within normal limits  URINALYSIS, ROUTINE W REFLEX MICROSCOPIC - Abnormal; Notable for the following:    Leukocytes, UA LARGE (*)    Bacteria, UA MANY (*)    Squamous Epithelial / LPF 0-5 (*)    All other components within normal limits  BRAIN NATRIURETIC PEPTIDE  CBG MONITORING, ED  I-STAT BETA HCG BLOOD, ED ([facility], [facility], [facility] ONLY)    EKG  EKG Interpretation  Date/Time:  Tuesday March 13 2017 13:20:37 EDT Ventricular Rate:  72 PR Interval:  156 QRS Duration: 84 QT Interval:  378 QTC Calculation: 413 R Axis:   87 Text Interpretation:  Normal sinus rhythm Normal ECG No significant change since last tracing Confirmed by Addison Lank [phone]) on 03/13/2017 6:57:57 PM       Radiology No results found.  Procedures Procedures (including critical care time)  Medications Ordered in ED Medications - No data to display   Initial Impression / Assessment and Plan / ED Course  I have reviewed the triage vital signs and the nursing notes.  Pertinent labs & imaging results that were available during my care of the patient were reviewed by me and considered in my medical decision making (see chart for details).     Patient able to ambulate.  She does appear to have a urinary tract infection she was treated here in the emergency department.  Patient without neurologic abnormality, EKG without concerning abnormalities, her lab work is also on concerning.  This may be secondary to stress.  However I  have advised patient to obtain thyroid testing, she may need Holter monitoring as she has noticed some episodes of racing heart.  Patient is to follow-up with her PCP closely.  I discussed return precautions with the patient.  She appears appropriate for discharge at this time. Patient seen in shared visit with attending physician. Who agrees with assessment, work up , treatment, and plan for discharge    Final Clinical Impressions(s) / ED Diagnoses   Final diagnoses:  Disequilibrium  Paroxysmal dyspnea  Acute cystitis without hematuria    New Prescriptions New Prescriptions   No medications on file     Margarita Mail, PA-C 03/13/17 2115

## 2017-03-13 NOTE — ED Triage Notes (Signed)
Patient complains of 1 week of dizziness and intermittent SOB. Speaking complete sentences on arrival. Denies CP, NO distress, alert and oriented

## 2017-03-13 NOTE — Discharge Instructions (Signed)
Contact a health care provider if: °Your condition does not improve as soon as expected. °You have a hard time doing your normal activities, even after you rest. °You have new symptoms. °Get help right away if: °Your shortness of breath gets worse. °You have shortness of breath when you are resting. °You feel light-headed or you faint. °You have a cough that is not controlled with medicines. °You cough up blood. °You have pain with breathing. °You have pain in your chest, arms, shoulders, or abdomen. °You have a fever. °You cannot walk up stairs or exercise the way that you normally do. °

## 2017-03-13 NOTE — ED Notes (Signed)
Patient transported to X-ray 

## 2017-09-26 ENCOUNTER — Other Ambulatory Visit: Payer: Self-pay | Admitting: Sports Medicine

## 2017-09-26 DIAGNOSIS — M25561 Pain in right knee: Secondary | ICD-10-CM

## 2019-11-07 DIAGNOSIS — R2242 Localized swelling, mass and lump, left lower limb: Secondary | ICD-10-CM | POA: Diagnosis not present

## 2019-11-07 DIAGNOSIS — M25562 Pain in left knee: Secondary | ICD-10-CM | POA: Diagnosis not present

## 2019-11-18 DIAGNOSIS — R600 Localized edema: Secondary | ICD-10-CM | POA: Diagnosis not present

## 2019-11-18 DIAGNOSIS — E669 Obesity, unspecified: Secondary | ICD-10-CM | POA: Diagnosis not present

## 2019-11-18 DIAGNOSIS — Z1389 Encounter for screening for other disorder: Secondary | ICD-10-CM | POA: Diagnosis not present

## 2019-11-18 DIAGNOSIS — J449 Chronic obstructive pulmonary disease, unspecified: Secondary | ICD-10-CM | POA: Diagnosis not present

## 2019-11-18 DIAGNOSIS — R82998 Other abnormal findings in urine: Secondary | ICD-10-CM | POA: Diagnosis not present

## 2019-11-18 DIAGNOSIS — I10 Essential (primary) hypertension: Secondary | ICD-10-CM | POA: Diagnosis not present

## 2020-05-17 DIAGNOSIS — Z03818 Encounter for observation for suspected exposure to other biological agents ruled out: Secondary | ICD-10-CM | POA: Diagnosis not present

## 2020-05-17 DIAGNOSIS — R110 Nausea: Secondary | ICD-10-CM | POA: Diagnosis not present

## 2020-05-17 DIAGNOSIS — J209 Acute bronchitis, unspecified: Secondary | ICD-10-CM | POA: Diagnosis not present

## 2020-05-17 DIAGNOSIS — U071 COVID-19: Secondary | ICD-10-CM | POA: Diagnosis not present

## 2020-05-17 DIAGNOSIS — J019 Acute sinusitis, unspecified: Secondary | ICD-10-CM | POA: Diagnosis not present

## 2020-05-24 ENCOUNTER — Emergency Department
Admission: EM | Admit: 2020-05-24 | Discharge: 2020-05-24 | Disposition: A | Discharge status: Home / Self Care | Payer: BC Managed Care – PPO | Attending: Emergency Medicine | Admitting: Emergency Medicine

## 2020-05-24 ENCOUNTER — Other Ambulatory Visit: Payer: Self-pay

## 2020-05-24 ENCOUNTER — Encounter: Payer: Self-pay | Admitting: Emergency Medicine

## 2020-05-24 ENCOUNTER — Emergency Department: Payer: BC Managed Care – PPO

## 2020-05-24 DIAGNOSIS — U071 COVID-19: Secondary | ICD-10-CM | POA: Insufficient documentation

## 2020-05-24 DIAGNOSIS — J441 Chronic obstructive pulmonary disease with (acute) exacerbation: Secondary | ICD-10-CM | POA: Diagnosis not present

## 2020-05-24 DIAGNOSIS — R0602 Shortness of breath: Secondary | ICD-10-CM | POA: Diagnosis not present

## 2020-05-24 HISTORY — DX: Chronic obstructive pulmonary disease, unspecified: J44.9

## 2020-05-24 LAB — BASIC METABOLIC PANEL
Anion gap: 14 (ref 5–15)
BUN: 16 mg/dL (ref 6–20)
CO2: 28 mmol/L (ref 22–32)
Calcium: 9.2 mg/dL (ref 8.9–10.3)
Chloride: 96 mmol/L — ABNORMAL LOW (ref 98–111)
Creatinine, Ser: 0.56 mg/dL (ref 0.44–1.00)
GFR, Estimated: >60 mL/min (ref >60)
Glucose, Bld: 110 mg/dL — ABNORMAL HIGH (ref 70–99)
Potassium: 3 mmol/L — ABNORMAL LOW (ref 3.5–5.1)
Sodium: 138 mmol/L (ref 135–145)

## 2020-05-24 LAB — CBC
HCT: 42.2 % (ref 36.0–46.0)
Hemoglobin: 14.5 g/dL (ref 12.0–15.0)
MCH: 30.3 pg (ref 26.0–34.0)
MCHC: 34.4 g/dL (ref 30.0–36.0)
MCV: 88.3 fL (ref 80.0–100.0)
Platelets: 351 10*3/uL (ref 150–400)
RBC: 4.78 MIL/uL (ref 3.87–5.11)
RDW: 13.9 % (ref 11.5–15.5)
WBC: 10.5 10*3/uL (ref 4.0–10.5)
nRBC: 0 % (ref 0.0–0.2)

## 2020-05-24 MED ORDER — POTASSIUM CHLORIDE CRYS ER 20 MEQ PO TBCR
40.0000 meq | EXTENDED_RELEASE_TABLET | Freq: Once | ORAL | Status: AC
  Administered 2020-05-24: 40 meq via ORAL
  Filled 2020-05-24: qty 2

## 2020-05-24 MED ORDER — ALBUTEROL SULFATE HFA 108 (90 BASE) MCG/ACT IN AERS
2.0000 | INHALATION_SPRAY | Freq: Four times a day (QID) | RESPIRATORY_TRACT | 0 refills | Status: DC | PRN
Start: 2020-05-24 — End: 2020-05-24

## 2020-05-24 MED ORDER — SODIUM CHLORIDE 0.9 % IV BOLUS
1000.0000 mL | Freq: Once | INTRAVENOUS | Status: AC
  Administered 2020-05-24: 1000 mL via INTRAVENOUS

## 2020-05-24 MED ORDER — POTASSIUM CHLORIDE ER 10 MEQ PO TBCR: 10.0000 meq | EXTENDED_RELEASE_TABLET | Freq: Two times a day (BID) | ORAL | 0 refills | Status: DC

## 2020-05-24 MED ORDER — PREDNISONE 20 MG PO TABS: 40.0000 mg | ORAL_TABLET | Freq: Every day | ORAL | 0 refills | Status: DC

## 2020-05-24 MED ORDER — PREDNISONE 10 MG PO TABS: ORAL_TABLET | ORAL | 0 refills | Status: AC

## 2020-05-24 MED ORDER — IPRATROPIUM-ALBUTEROL 0.5-2.5 (3) MG/3ML IN SOLN
3.0000 mL | Freq: Once | RESPIRATORY_TRACT | Status: AC
  Administered 2020-05-24: 3 mL via RESPIRATORY_TRACT
  Filled 2020-05-24: qty 3

## 2020-05-24 MED ORDER — ALBUTEROL SULFATE HFA 108 (90 BASE) MCG/ACT IN AERS: 2.0000 | INHALATION_SPRAY | Freq: Four times a day (QID) | RESPIRATORY_TRACT | 0 refills | Status: DC | PRN

## 2020-05-24 NOTE — ED Notes (Signed)
Positive since last Monday covid 19

## 2020-05-24 NOTE — ED Notes (Signed)
Pt ststaes she is feeling dizzy and sob worse past 2 days , covid positive since last Sunday

## 2020-05-24 NOTE — ED Triage Notes (Signed)
Pt to ER states she was tested pos with Covid last Monday and she feels like she is getting worse.  Reports SHOB, dizziness and near syncopal episodes.  Pt talking in complete uninterrupted sentences.

## 2020-05-24 NOTE — Discharge Instructions (Addendum)
Take the steroid as prescribed  Use the albuterol inhaler every 8 hours for the next 2-3 days, then as needed for shortness of breath or wheezing  Drink at least 6-8 glasses of water daily

## 2020-05-24 NOTE — ED Triage Notes (Signed)
First Nurse Note:  Arrives from [facility] for evaluation of feeling lightheaded.  Patient is Covid +.  AAOx3.  Skin warm and dry. NAD

## 2020-05-24 NOTE — ED Provider Notes (Signed)
[facility] Emergency Department Provider Note  ____________________________________________   Event Date/Time   First MD Initiated Contact with Patient 05/24/20 1519     (approximate)  I have reviewed the triage vital signs and the nursing notes.   HISTORY  Chief Complaint Shortness of Breath    HPI Wanda Goodman is a 60 y.o. female  With h/o COPD here with cough, SOB. Pt reports that she was diagnosed with COVID about 1 week ago. She was given steroids, abx, and cough syrup. She's had ongoing SOB, fatigue since then. SHe's had loss of taste/smell along with decreased PO intake. She feels like she's had more SOB over past 24 hours along with lightheadedness w/ standing. She feels generally weak. She has had some wheezing as well, but ran out of her home inhaler. She continues to have low-grade fevers. She did receive one set of shots for her COVID vaccination. Mild constant HA noted. No specific alleviating or aggravating factors.       Past Medical History:  Diagnosis Date   COPD (chronic obstructive pulmonary disease) ([facility])     There are no problems to display for this patient.   Past Surgical History:  Procedure Laterality Date   CHOLECYSTECTOMY      Prior to Admission medications   Medication Sig Start Date End Date Taking? Authorizing Provider  albuterol (VENTOLIN HFA) 108 (90 Base) MCG/ACT inhaler Inhale 2 puffs into the lungs every 6 (six) hours as needed for wheezing or shortness of breath. 05/24/20   Shaune PollackIsaacs, Trygve Thal, MD  potassium chloride (KLOR-CON) 10 MEQ tablet Take 1 tablet (10 mEq total) by mouth 2 (two) times daily for 3 days. 05/24/20 05/27/20  Shaune PollackIsaacs, Nakota Elsen, MD  predniSONE (DELTASONE) 10 MG tablet Take 4 tablets (40 mg total) by mouth daily for 2 days, THEN 3 tablets (30 mg total) daily for 2 days, THEN 2 tablets (20 mg total) daily for 2 days, THEN 1 tablet (10 mg total) daily for 2 days. 05/24/20 06/01/20  Shaune PollackIsaacs, Sydnee Lamour, MD     Allergies Patient has no known allergies.  History reviewed. No pertinent family history.  Social History Social History   Tobacco Use   Smoking status: Never Smoker   Smokeless tobacco: Never Used    Review of Systems  Review of Systems  Constitutional: Positive for fatigue. Negative for chills and fever.  HENT: Negative for sore throat.   Respiratory: Positive for cough, shortness of breath and wheezing.   Cardiovascular: Negative for chest pain.  Gastrointestinal: Negative for abdominal pain.  Genitourinary: Negative for flank pain.  Musculoskeletal: Negative for neck pain.  Skin: Negative for rash and wound.  Allergic/Immunologic: Negative for immunocompromised state.  Neurological: Positive for weakness. Negative for numbness.  Hematological: Does not bruise/bleed easily.     ____________________________________________  PHYSICAL EXAM:      VITAL SIGNS: ED Triage Vitals  Enc Vitals Group     BP 05/24/20 1331 (!) 156/85     Pulse Rate 05/24/20 1331 (!) 101     Resp 05/24/20 1331 20     Temp 05/24/20 1331 99.4 F (37.4 C)     Temp Source 05/24/20 1331 Oral     SpO2 05/24/20 1331 96 %     Weight 05/24/20 1332 175 lb (79.4 kg)     Height 05/24/20 1332 5' (1.524 m)     Head Circumference --      Peak Flow --      Pain Score 05/24/20 1332 0  Pain Loc --      Pain Edu? --      Excl. in [place]? --      Physical Exam Vitals and nursing note reviewed.  Constitutional:      General: She is not in acute distress.    Appearance: She is well-developed.  HENT:     Head: Normocephalic and atraumatic.  Eyes:     Conjunctiva/sclera: Conjunctivae normal.  Cardiovascular:     Rate and Rhythm: Regular rhythm.     Heart sounds: Normal heart sounds. No murmur heard. No friction rub.  Pulmonary:     Effort: Pulmonary effort is normal. No respiratory distress.     Breath sounds: Wheezing (bilateral mild wheezing) present. No rales.  Abdominal:     General:  There is no distension.     Palpations: Abdomen is soft.     Tenderness: There is no abdominal tenderness.  Musculoskeletal:     Cervical back: Neck supple.  Skin:    General: Skin is warm.     Capillary Refill: Capillary refill takes less than 2 seconds.  Neurological:     Mental Status: She is alert and oriented to person, place, and time.     Motor: No abnormal muscle tone.       ____________________________________________   LABS (all labs ordered are listed, but only abnormal results are displayed)  Labs Reviewed  BASIC METABOLIC PANEL - Abnormal; Notable for the following components:      Result Value   Potassium 3.0 (*)    Chloride 96 (*)    Glucose, Bld 110 (*)    All other components within normal limits  CBC    ____________________________________________  EKG: Normal sinus rhythm, VR 84. PR 150, QRS 72, QTc 397. No acute ST elevation or depression. ________________________________________  RADIOLOGY All imaging, including plain films, CT scans, and ultrasounds, independently reviewed by me, and interpretations confirmed via formal radiology reads.  ED MD interpretation:   CXR: Subtle peripheral airspace densities  Official radiology report(s): DG Chest 2 View  Result Date: 05/24/2020 CLINICAL DATA:  Short of breath.  COVID positive EXAM: CHEST - 2 VIEW COMPARISON:  None. FINDINGS: Normal cardiac silhouette. There are subtle peripheral patchy densities. Low lung volumes. No pleural fluid no pneumothorax. IMPRESSION: Subtle peripheral airspace densities could represent early COVID pneumonia. Electronically Signed   By: Suzy Bouchard M.D.   On: 05/24/2020 14:09    ____________________________________________  PROCEDURES   Procedure(s) performed (including Critical Care):  Procedures  ____________________________________________  INITIAL IMPRESSION / MDM / [place] / ED COURSE  As part of my medical decision making, I reviewed the  following data within the [place] notes reviewed and incorporated, Old chart reviewed, Notes from prior ED visits, and [place] Controlled Substance [place] was evaluated in Emergency Department on 05/24/2020 for the symptoms described in the history of present illness. She was evaluated in the context of the global COVID-19 pandemic, which necessitated consideration that the patient might be at risk for infection with the SARS-CoV-2 virus that causes COVID-19. Institutional protocols and algorithms that pertain to the evaluation of patients at risk for COVID-19 are in a state of rapid change based on information released by regulatory bodies including the CDC and federal and state organizations. These policies and algorithms were followed during the patient's care in the ED.  Some ED evaluations and interventions may be delayed as a result of limited staffing  during the pandemic.*     Medical Decision Making:  60 yo F here with cough, SOB, fatigue and lightheadedness, worse w/ standing, in setting of COVID-19. Suspect ongoing mild resp symptoms related to COVID, with component of dehydration/orthostasis 2/2 poor PO intake. Labs reviewed and are unremarkable with exception of mild hypokalemia. K replaced here. CXR with subtle airspace disease 2/2 COVID-19. SHe is afebrile, non-toxic, without signs to suggest bacterial superinfection with normal WBC, and she has already been on ABX. I do suspect she has a component of ongoing COPD/wheezing, which is contributing to her SOB. Will prolong a course of steroids, start her on inhalers, and advise continued supportive care and hydration at home. Return precautions given.  ____________________________________________  FINAL CLINICAL IMPRESSION(S) / ED DIAGNOSES  Final diagnoses:  COVID-19  COPD exacerbation ([place])     MEDICATIONS GIVEN DURING THIS VISIT:  Medications  sodium chloride 0.9 % bolus 1,000 mL  (1,000 mLs Intravenous New Bag/Given 05/24/20 1550)  ipratropium-albuterol (DUONEB) 0.5-2.5 (3) MG/3ML nebulizer solution 3 mL (3 mLs Nebulization Given 05/24/20 1557)  potassium chloride SA (KLOR-CON) CR tablet 40 mEq (40 mEq Oral Given 05/24/20 1556)     ED Discharge Orders         Ordered    albuterol (VENTOLIN HFA) 108 (90 Base) MCG/ACT inhaler  Every 6 hours PRN,   Status:  Discontinued        05/24/20 1612    potassium chloride (KLOR-CON) 10 MEQ tablet  2 times daily,   Status:  Discontinued        05/24/20 1612    predniSONE (DELTASONE) 20 MG tablet  Daily,   Status:  Discontinued        05/24/20 1612    predniSONE (DELTASONE) 10 MG tablet        05/24/20 1613    potassium chloride (KLOR-CON) 10 MEQ tablet  2 times daily        05/24/20 1613    albuterol (VENTOLIN HFA) 108 (90 Base) MCG/ACT inhaler  Every 6 hours PRN        05/24/20 1613           Note:  This document was prepared using Dragon voice recognition software and may include unintentional dictation errors.   Duffy Bruce, MD 05/24/20 [phone]

## 2020-06-14 ENCOUNTER — Encounter (HOSPITAL_COMMUNITY): Payer: Self-pay

## 2020-09-16 DIAGNOSIS — Z03818 Encounter for observation for suspected exposure to other biological agents ruled out: Secondary | ICD-10-CM | POA: Diagnosis not present

## 2020-09-16 DIAGNOSIS — R030 Elevated blood-pressure reading, without diagnosis of hypertension: Secondary | ICD-10-CM | POA: Diagnosis not present

## 2020-09-16 DIAGNOSIS — Z20822 Contact with and (suspected) exposure to covid-19: Secondary | ICD-10-CM | POA: Diagnosis not present

## 2021-05-05 IMAGING — CR DG CHEST 2V
1 series · 2 of 2 positions shown · non-contrast
Comparison: None.

CLINICAL DATA: Short of breath.  COVID positive

EXAM:
CHEST - 2 VIEW

[Series 1: w chest pa · 0.14mm/px · 2 of 2 slices shown]
[im 1/2]
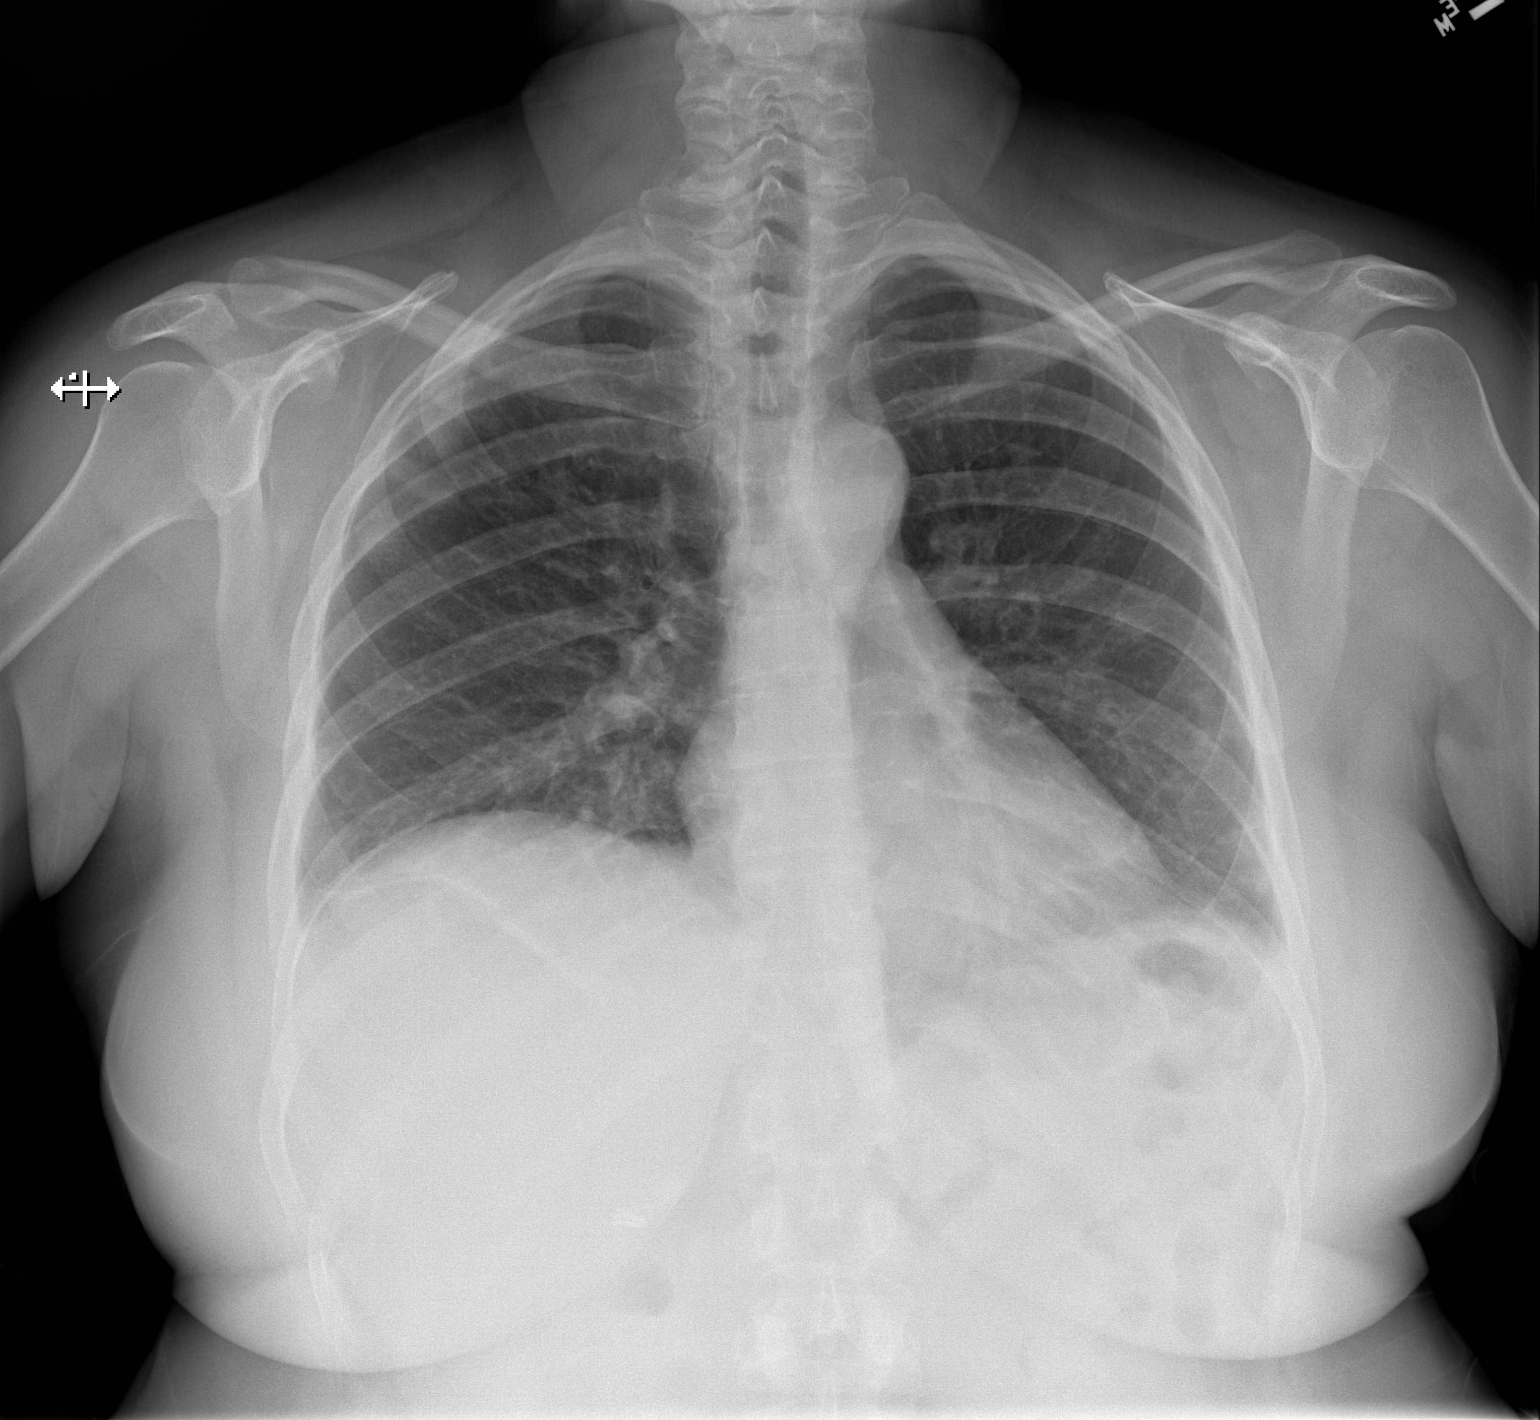
[im 2/2]
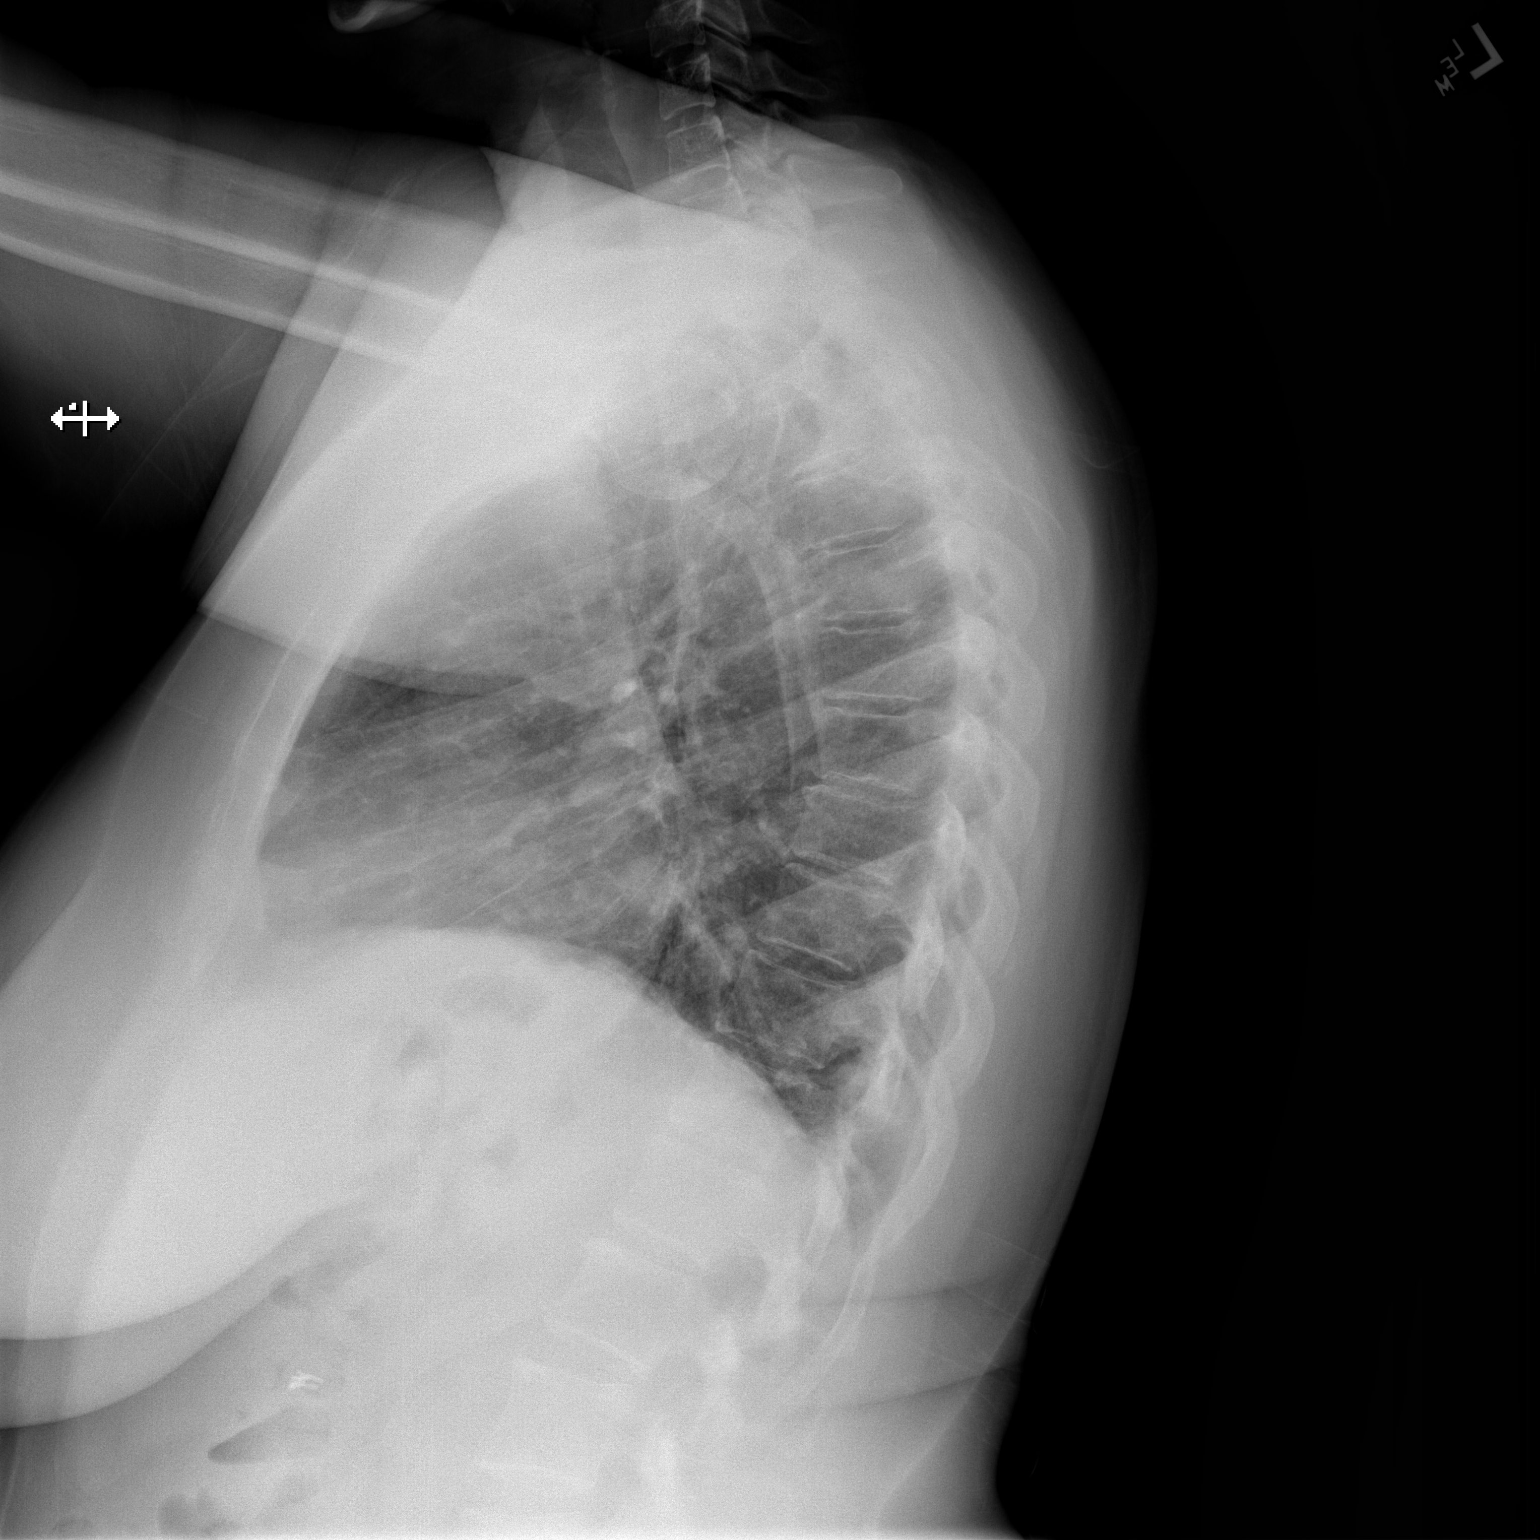

[2 of 2 positions shown; findings below may reference images not displayed]

FINDINGS: Normal cardiac silhouette. There are subtle peripheral patchy
densities. Low lung volumes. No pleural fluid no pneumothorax.
IMPRESSION: Subtle peripheral airspace densities could represent early COVID
pneumonia.

## 2021-09-05 ENCOUNTER — Telehealth (INDEPENDENT_AMBULATORY_CARE_PROVIDER_SITE_OTHER): Payer: No Typology Code available for payment source | Admitting: Family

## 2021-09-05 ENCOUNTER — Encounter: Payer: Self-pay | Admitting: Family

## 2021-09-05 VITALS — Ht 61.0 in | Wt 185.0 lb

## 2021-09-05 DIAGNOSIS — Z1231 Encounter for screening mammogram for malignant neoplasm of breast: Secondary | ICD-10-CM

## 2021-09-05 DIAGNOSIS — Z780 Asymptomatic menopausal state: Secondary | ICD-10-CM

## 2021-09-05 DIAGNOSIS — J45909 Unspecified asthma, uncomplicated: Secondary | ICD-10-CM | POA: Insufficient documentation

## 2021-09-05 DIAGNOSIS — Z1211 Encounter for screening for malignant neoplasm of colon: Secondary | ICD-10-CM

## 2021-09-05 DIAGNOSIS — E669 Obesity, unspecified: Secondary | ICD-10-CM

## 2021-09-05 DIAGNOSIS — J4520 Mild intermittent asthma, uncomplicated: Secondary | ICD-10-CM

## 2021-09-05 NOTE — Patient Instructions (Addendum)
I sent in an order for cologuard. You should receive it in the next few weeks. Please complete and mail back.  ? ?Call [facility] to schedule your mammogram and bone density as I have sent the electronic order to their facility.  ?Here is their number : [phone]  ? ?Please schedule follow up appointment for pap smear and come fasting (black coffee and water only) the day of.  ? ?Welcome to our clinic, I am happy to have you as my new patient. I am excited to continue on this healthcare journey with you. ? ?Please keep in mind ?Any my chart messages you send have p to a three business day turnaround for a response.  ?Phone calls may have up to a one day business turnaround for a  response.  ? ?If you need a medication refill I recommend you request it through the pharmacy as this is easiest for [place] rather than sending a message and or phone call.  ? ?Due to recent changes in healthcare laws, you may see results of your imaging and/or laboratory studies on MyChart before I have had a chance to review them.  I understand that in some cases there may be results that are confusing or concerning to you. Please understand that not all results are received at the same time and often I may need to interpret multiple results in order to provide you with the best plan of care or course of treatment. Therefore, I ask that you please give me 2 business days to thoroughly review all your results before contacting my office for clarification. Should we see a critical lab result, you will be contacted sooner.  ? ?It was a pleasure seeing you today! Please do not hesitate to reach out with any questions and or concerns. ? ?Regards,  ? ?Keiran Gaffey ?FNP-C ? ? ? ? ? ? ?

## 2021-09-05 NOTE — Assessment & Plan Note (Signed)
No past history to suggest COPD, may consider PFTs/spirometry in the future if exacerbations however for now only illness induced. Pt has albuterol inhaler, not using much at all.  ?

## 2021-09-05 NOTE — Assessment & Plan Note (Signed)
Ordered cologuard, pt declines colonoscopy. Pending results  ?

## 2021-09-05 NOTE — Assessment & Plan Note (Signed)
Consider workup for inability to lose weight, consider thyroid panel at next visit/hga1c. ?

## 2021-09-05 NOTE — Assessment & Plan Note (Signed)
Bone density ordered pending results.   

## 2021-09-05 NOTE — Assessment & Plan Note (Signed)
Mammogram ordered. Pending results. 

## 2021-09-05 NOTE — Progress Notes (Signed)
? ?New Patient Office Visit ?MyChart Video Visit ? ? ? ?Virtual Visit via Video Note  ? ?This visit type was conducted due to national recommendations for restrictions regarding the COVID-19 Pandemic (e.g. social distancing) in an effort to limit this patient's exposure and mitigate transmission in our community. This patient is at least at moderate risk for complications without adequate follow up. This format is felt to be most appropriate for this patient at this time. Physical exam was limited by quality of the video and audio technology used for the visit. CMA was able to get the patient set up on a video visit. ? ?Patient location: Home. Patient and provider in visit ?Provider location: Office ? ?I discussed the limitations of evaluation and management by telemedicine and the availability of in person appointments. The patient expressed understanding and agreed to proceed. ? ?Visit Date: 09/05/2021 ? ?Today's healthcare provider: Eugenia Pancoast, FNP   ? ?Subjective:  ?Patient ID: Wanda Goodman, female    DOB: [birth year]  Age: 61 y.o. MRN: [id number] ? ?CC:  ?Chief Complaint  ?Patient presents with  ? New Patient (Initial Visit)  ? ? ?HPI ?Wanda Goodman is here to establish care as a new patient.  ? ?Prior provider was: a provider in [place], hasn't been for a few years.  ?Pt is with acute concerns.  ? ?Pt is concerned about weight issues. Used to do herballife and then once remarried got comfortable not really exercising , she is only 5'1 and she weights 185 pounds which she feels is heavy for her. She is stable in her weight just hard to lose, does not have a lot of fatigue but it is very hard to lose weight. She does have dry skin. Hair is getting thinner a bit.   ? ?chronic concerns: ? ?COPD: states was diagnosed years ago when she went to the ER for an acute exacerbation and was feeling sick. Otherwise she doesn't feel sob or doe. She never went to pulmonary and or obtained PFTS/spirometry.  ? ?Pmp: periods  stopped in her 40's ? ?Pap smear: has been about three years since her last pap smear.  ?Colonoscopy: pt declines.  ?Shingrix vaccine: pt knows she is due.  ? ?Past Medical History:  ?Diagnosis Date  ? Cholelithiasis without cholecystitis 05/06/2015  ? COPD (chronic obstructive pulmonary disease) ([place])   ? Pneumonia 05-2014  ? ? ?Past Surgical History:  ?Procedure Laterality Date  ? CESAREAN SECTION    ? CHOLECYSTECTOMY N/A 05/25/2015  ? Procedure: LAPAROSCOPIC CHOLECYSTECTOMY;  Surgeon: Clayburn Pert, MD;  Location: [facility] ORS;  Service: General;  Laterality: N/A;  ? ? ?Family History  ?Problem Relation Age of Onset  ? Hypertension Mother   ? COPD Mother   ? Heart disease Mother   ? COPD Brother   ? ? ?Social History  ? ?Socioeconomic History  ? Marital status: Married  ?  Spouse name: Not on file  ? Number of children: 2  ? Years of education: Not on file  ? Highest education level: Not on file  ?Occupational History  ? Occupation: industrial paper products  ?Tobacco Use  ? Smoking status: Never  ?  Passive exposure: Past  ? Smokeless tobacco: Never  ?Vaping Use  ? Vaping Use: Never used  ?Substance and Sexual Activity  ? Alcohol use: Yes  ?  Alcohol/week: 1.0 standard drink  ?  Types: 1 Glasses of wine per week  ?  Comment: rarely  ? Drug use: No  ? Sexual activity:  Yes  ?  Partners: Male  ?  Birth control/protection: Post-menopausal  ?Other Topics Concern  ? Not on file  ?Social History Narrative  ? ** Merged History Encounter **  ?    ? ?Social Determinants of Health  ? ?Financial Resource Strain: Not on file  ?Food Insecurity: Not on file  ?Transportation Needs: Not on file  ?Physical Activity: Not on file  ?Stress: Not on file  ?Social Connections: Not on file  ?Intimate Partner Violence: Not on file  ? ? ?Outpatient Medications Prior to Visit  ?Medication Sig Dispense Refill  ? albuterol (VENTOLIN HFA) 108 (90 Base) MCG/ACT inhaler Inhale 2 puffs into the lungs.    ? acetaminophen (TYLENOL) 500 MG tablet Take  1,000 mg by mouth every 4 (four) hours as needed for moderate pain.    ? albuterol (VENTOLIN HFA) 108 (90 Base) MCG/ACT inhaler Inhale 2 puffs into the lungs every 6 (six) hours as needed for wheezing or shortness of breath. 8 g 0  ? meclizine (ANTIVERT) 25 MG tablet Take 1 tablet (25 mg total) by mouth 3 (three) times daily as needed for dizziness. 30 tablet 0  ? meclizine (ANTIVERT) 25 MG tablet Take 1 tablet (25 mg total) by mouth 3 (three) times daily as needed for dizziness. 30 tablet 0  ? potassium chloride (KLOR-CON) 10 MEQ tablet Take 1 tablet (10 mEq total) by mouth 2 (two) times daily for 3 days. 6 tablet 0  ? ?No facility-administered medications prior to visit.  ? ? ?No Known Allergies ? ?ROS ?Review of Systems  ?Constitutional:  Positive for unexpected weight change (difficultly with losing weight). Negative for chills, fatigue and fever.  ?Eyes:  Negative for visual disturbance.  ?Respiratory:  Negative for cough, chest tightness, shortness of breath and wheezing.   ?Cardiovascular:  Negative for chest pain, palpitations and leg swelling.  ?Gastrointestinal:  Negative for abdominal pain.  ?Endocrine:  ?     Slight thinning of hair but thinks was due to her washing hair daily  ?Genitourinary:  Negative for difficulty urinating.  ?Skin:  Negative for rash.  ?     Dryness of skin  ?Neurological:  Negative for dizziness and headaches.  ? ? ? ?  ?Objective:  ?  ?Physical Exam ?Constitutional:   ?   General: She is not in acute distress. ?   Appearance: Normal appearance. She is obese. She is not ill-appearing, toxic-appearing or diaphoretic.  ?Pulmonary:  ?   Effort: Pulmonary effort is normal.  ?Neurological:  ?   Mental Status: She is alert.  ? ? ? ? ?Ht '5\' 1"'$  (1.549 m)   Wt 185 lb (83.9 kg)   LMP 05/13/2013   BMI 34.96 kg/m?  ?Wt Readings from Last 3 Encounters:  ?09/05/21 185 lb (83.9 kg)  ?05/24/20 175 lb (79.4 kg)  ?03/13/17 150 lb (68 kg)  ? ? ? ?Health Maintenance Due  ?Topic Date Due  ?  TETANUS/TDAP  Never done  ? PAP SMEAR-Modifier  Never done  ? COLONOSCOPY (Pts 45-75yr Insurance coverage will need to be confirmed)  Never done  ? MAMMOGRAM  Never done  ? Zoster Vaccines- Shingrix (1 of 2) Never done  ? ? ?There are no preventive care reminders to display for this patient. ? ?No results found for: TSH ?Lab Results  ?Component Value Date  ? WBC 10.5 05/24/2020  ? HGB 14.5 05/24/2020  ? HCT 42.2 05/24/2020  ? MCV 88.3 05/24/2020  ? PLT 351 05/24/2020  ? ?  Lab Results  ?Component Value Date  ? NA 138 05/24/2020  ? K 3.0 (L) 05/24/2020  ? CO2 28 05/24/2020  ? GLUCOSE 110 (H) 05/24/2020  ? BUN 16 05/24/2020  ? CREATININE 0.56 05/24/2020  ? BILITOT 0.4 04/23/2015  ? ALKPHOS 50 04/23/2015  ? AST 22 04/23/2015  ? ALT 26 04/23/2015  ? PROT 8.2 (H) 04/23/2015  ? ALBUMIN 4.3 04/23/2015  ? CALCIUM 9.2 05/24/2020  ? ANIONGAP 14 05/24/2020  ? ?No results found for: CHOL ?No results found for: HDL ?No results found for: [place] ?No results found for: TRIG ?No results found for: CHOLHDL ?No results found for: HGBA1C ? ?  ?Assessment & Plan:  ? ?Problem List Items Addressed This Visit   ? ?  ? Respiratory  ? Reactive airway disease - Primary  ?  No past history to suggest COPD, may consider PFTs/spirometry in the future if exacerbations however for now only illness induced. Pt has albuterol inhaler, not using much at all.  ? ?  ?  ?  ? Other  ? Obesity (BMI 30-39.9)  ?  Consider workup for inability to lose weight, consider thyroid panel at next visit/hga1c. ? ?  ?  ? Post-menopausal  ?  Bone density ordered pending results ? ?  ?  ? Relevant Orders  ? DG Bone Density  ? Screening mammogram for breast cancer  ?  Mammogram ordered. Pending results. ? ? ?  ?  ? Relevant Orders  ? MM 3D SCREEN BREAST BILATERAL  ? Screening for malignant neoplasm of colon  ?  Ordered cologuard, pt declines colonoscopy. Pending results  ? ?  ?  ? Relevant Orders  ? Cologuard  ? ? ?No orders of the defined types were placed in this  encounter. ? ? ?Follow-up: No follow-ups on file.  ? ?I discussed the assessment and treatment plan with the patient. The patient was provided an opportunity to ask questions and all were answered. The patient agre

## 2021-09-19 ENCOUNTER — Ambulatory Visit: Payer: No Typology Code available for payment source | Admitting: Family

## 2021-09-20 ENCOUNTER — Other Ambulatory Visit: Payer: No Typology Code available for payment source

## 2021-10-18 ENCOUNTER — Ambulatory Visit: Payer: No Typology Code available for payment source | Admitting: Family

## 2021-11-09 ENCOUNTER — Other Ambulatory Visit: Payer: No Typology Code available for payment source

## 2021-12-26 ENCOUNTER — Other Ambulatory Visit (HOSPITAL_COMMUNITY)
Admission: RE | Admit: 2021-12-26 | Discharge: 2021-12-26 | Disposition: A | Discharge status: Home / Self Care | Payer: No Typology Code available for payment source | Source: Ambulatory Visit | Attending: Family | Admitting: Family

## 2021-12-26 ENCOUNTER — Ambulatory Visit (INDEPENDENT_AMBULATORY_CARE_PROVIDER_SITE_OTHER): Payer: No Typology Code available for payment source | Admitting: Family

## 2021-12-26 ENCOUNTER — Encounter: Payer: Self-pay | Admitting: Family

## 2021-12-26 VITALS — BP 142/70 | HR 70 | Temp 98.6°F | Resp 16 | Ht 61.0 in | Wt 189.4 lb

## 2021-12-26 DIAGNOSIS — E6609 Other obesity due to excess calories: Secondary | ICD-10-CM | POA: Diagnosis not present

## 2021-12-26 DIAGNOSIS — Z0001 Encounter for general adult medical examination with abnormal findings: Secondary | ICD-10-CM | POA: Diagnosis not present

## 2021-12-26 DIAGNOSIS — Z23 Encounter for immunization: Secondary | ICD-10-CM | POA: Diagnosis not present

## 2021-12-26 DIAGNOSIS — Z01419 Encounter for gynecological examination (general) (routine) without abnormal findings: Secondary | ICD-10-CM | POA: Diagnosis present

## 2021-12-26 DIAGNOSIS — D492 Neoplasm of unspecified behavior of bone, soft tissue, and skin: Secondary | ICD-10-CM

## 2021-12-26 DIAGNOSIS — Z6836 Body mass index (BMI) 36.0-36.9, adult: Secondary | ICD-10-CM | POA: Diagnosis not present

## 2021-12-26 DIAGNOSIS — Z13220 Encounter for screening for lipoid disorders: Secondary | ICD-10-CM | POA: Diagnosis not present

## 2021-12-26 DIAGNOSIS — Z1211 Encounter for screening for malignant neoplasm of colon: Secondary | ICD-10-CM

## 2021-12-26 DIAGNOSIS — Z1231 Encounter for screening mammogram for malignant neoplasm of breast: Secondary | ICD-10-CM

## 2021-12-26 DIAGNOSIS — K644 Residual hemorrhoidal skin tags: Secondary | ICD-10-CM

## 2021-12-26 DIAGNOSIS — E669 Obesity, unspecified: Secondary | ICD-10-CM

## 2021-12-26 DIAGNOSIS — Z780 Asymptomatic menopausal state: Secondary | ICD-10-CM

## 2021-12-26 LAB — CBC WITH DIFFERENTIAL/PLATELET
Basophils Absolute: 0.1 10*3/uL (ref 0.0–0.1)
Basophils Relative: 0.7 % (ref 0.0–3.0)
Eosinophils Absolute: 0.1 10*3/uL (ref 0.0–0.7)
Eosinophils Relative: 1.6 % (ref 0.0–5.0)
HCT: 41.9 % (ref 36.0–46.0)
Hemoglobin: 13.7 g/dL (ref 12.0–15.0)
Lymphocytes Relative: 24.2 % (ref 12.0–46.0)
Lymphs Abs: 2 10*3/uL (ref 0.7–4.0)
MCHC: 32.7 g/dL (ref 30.0–36.0)
MCV: 92.9 fl (ref 78.0–100.0)
Monocytes Absolute: 0.6 10*3/uL (ref 0.1–1.0)
Monocytes Relative: 7.5 % (ref 3.0–12.0)
Neutro Abs: 5.4 10*3/uL (ref 1.4–7.7)
Neutrophils Relative %: 66 % (ref 43.0–77.0)
Platelets: 291 10*3/uL (ref 150.0–400.0)
RBC: 4.51 Mil/uL (ref 3.87–5.11)
RDW: 14 % (ref 11.5–15.5)
WBC: 8.1 10*3/uL (ref 4.0–10.5)

## 2021-12-26 LAB — LIPID PANEL
Cholesterol: 221 mg/dL — ABNORMAL HIGH (ref 0–200)
HDL: 46.2 mg/dL (ref >39.00)
LDL Cholesterol: 151 mg/dL — ABNORMAL HIGH (ref 0–99)
NonHDL: 174.57
Total CHOL/HDL Ratio: 5
Triglycerides: 118 mg/dL (ref 0.0–149.0)
VLDL: 23.6 mg/dL (ref 0.0–40.0)

## 2021-12-26 LAB — COMPREHENSIVE METABOLIC PANEL
ALT: 23 U/L (ref 0–35)
AST: 19 U/L (ref 0–37)
Albumin: 4.2 g/dL (ref 3.5–5.2)
Alkaline Phosphatase: 58 U/L (ref 39–117)
BUN: 14 mg/dL (ref 6–23)
CO2: 29 mEq/L (ref 19–32)
Calcium: 9.3 mg/dL (ref 8.4–10.5)
Chloride: 103 mEq/L (ref 96–112)
Creatinine, Ser: 0.7 mg/dL (ref 0.40–1.20)
GFR: 94.54 mL/min (ref >60.00)
Glucose, Bld: 92 mg/dL (ref 70–99)
Potassium: 3.9 mEq/L (ref 3.5–5.1)
Sodium: 141 mEq/L (ref 135–145)
Total Bilirubin: 0.5 mg/dL (ref 0.2–1.2)
Total Protein: 7.1 g/dL (ref 6.0–8.3)

## 2021-12-26 LAB — TSH: TSH: 4.27 u[IU]/mL (ref 0.35–5.50)

## 2021-12-26 LAB — HEMOGLOBIN A1C: Hgb A1c MFr Bld: 6.3 % (ref 4.6–6.5)

## 2021-12-26 NOTE — Progress Notes (Unsigned)
Established Patient Office Visit  Subjective:  Patient ID: Wanda Goodman, female    DOB: [birth year]  Age: 61 y.o. MRN: [id number]  CC:  Chief Complaint  Patient presents with   Gynecologic Exam    HPI Wanda Goodman is here today for an well woman exam.   Pt is without acute concerns.   Mammogram: 2014? Needs to call to reschedule  Last pap: over five years, last one was negative. Colonoscopy: never had. Colo guard was sent, but it was ruined. She is still considering this.  Bone density scan: has to schedule, has order  Overdue fr TD and also due for shingles vaccine.   Past Medical History:  Diagnosis Date   Cholelithiasis without cholecystitis 05/06/2015   COPD (chronic obstructive pulmonary disease) ([place])    Pneumonia 05-2014    Past Surgical History:  Procedure Laterality Date   CESAREAN SECTION     CHOLECYSTECTOMY N/A 05/25/2015   Procedure: LAPAROSCOPIC CHOLECYSTECTOMY;  Surgeon: Wanda Pert, MD;  Location: [facility] ORS;  Service: General;  Laterality: N/A;    Family History  Problem Relation Age of Onset   Hypertension Mother    COPD Mother    Heart disease Mother    COPD Brother     Social History   Socioeconomic History   Marital status: Married    Spouse name: Not on file   Number of children: 2   Years of education: Not on file   Highest education level: Not on file  Occupational History   Occupation: industrial paper products  Tobacco Use   Smoking status: Never    Passive exposure: Past   Smokeless tobacco: Never  Vaping Use   Vaping Use: Never used  Substance and Sexual Activity   Alcohol use: Yes    Alcohol/week: 1.0 standard drink of alcohol    Types: 1 Glasses of wine per week    Comment: rarely   Drug use: No   Sexual activity: Yes    Partners: Male    Birth control/protection: Post-menopausal  Other Topics Concern   Not on file  Social History Narrative   ** Merged History Encounter **       Social Determinants of Health    Financial Resource Strain: Not on file  Food Insecurity: Not on file  Transportation Needs: Not on file  Physical Activity: Not on file  Stress: Not on file  Social Connections: Not on file  Intimate Partner Violence: Not on file    Outpatient Medications Prior to Visit  Medication Sig Dispense Refill   albuterol (VENTOLIN HFA) 108 (90 Base) MCG/ACT inhaler Inhale 2 puffs into the lungs.     No facility-administered medications prior to visit.    No Known Allergies  ROS Review of Systems  Review of Systems   Breasts: negative for nipple discharge, breast pain, breast mass, nipple changes, breast rash. Genitourinary:  Negative for decreased urine volume, difficulty urinating, dyspareunia, dysuria, flank pain, menstrual problem, pelvic pain, urgency, vaginal bleeding, vaginal discharge and vaginal pain.  Psychiatric/Behavioral:  Negative for depression and suicidal ideas.   All other systems reviewed and are negative.    Objective:      Physical Exam Constitutional:      General: She is not in acute distress.    Appearance: Normal appearance. She is normal weight. She is not ill-appearing, toxic-appearing or diaphoretic.  Cardiovascular:     Rate and Rhythm: Normal rate and regular rhythm.  Pulmonary:     Effort: Pulmonary effort is  normal.     Breath sounds: Normal breath sounds.  Chest:  Breasts:    Tanner Score is 5.     Breasts are symmetrical.     Right: Normal. No swelling, bleeding, inverted nipple, mass, nipple discharge, skin change or tenderness.     Left: Normal. No swelling, bleeding, inverted nipple, mass, nipple discharge, skin change or tenderness.  Abdominal:     General: Abdomen is flat.  Genitourinary:    General: Normal vulva.     Pubic Area: No rash or pubic lice.      Labia:        Right: No rash, tenderness, lesion or injury.        Left: No rash, tenderness, lesion or injury.      Urethra: No prolapse, urethral pain or urethral lesion.      Vagina: Vaginal discharge (white thin discharge) and lesions (top of vaginal canal with sliht tenderness. appears to be a small skin growth on the top of the vaginal wall) present. No tenderness.     Cervix: No discharge, erythema or cervical bleeding.     Adnexa:        Right: No mass, tenderness or fullness.         Left: No mass, tenderness or fullness.       Rectum: External hemorrhoid (hard but non tender , no blue discoloration. skin colored.) present.  Lymphadenopathy:     Upper Body:     Right upper body: No supraclavicular, axillary or pectoral adenopathy.     Left upper body: No supraclavicular, axillary or pectoral adenopathy.  Skin:    General: Skin is warm.     Comments: Dry scaly raised skin growth , mix of brown to black discoloration  Neurological:     General: No focal deficit present.     Mental Status: She is alert and oriented to person, place, and time. Mental status is at baseline.  Psychiatric:        Mood and Affect: Mood normal.        Behavior: Behavior normal.        Thought Content: Thought content normal.        Judgment: Judgment normal.       BP (!) 142/70   Pulse 70   Temp 98.6 F (37 C)   Resp 16   Ht '5\' 1"'$  (1.549 m)   Wt 189 lb 6 oz (85.9 kg)   LMP 05/13/2013   SpO2 94%   BMI 35.78 kg/m  Wt Readings from Last 3 Encounters:  12/26/21 189 lb 6 oz (85.9 kg)  09/05/21 185 lb (83.9 kg)  05/24/20 175 lb (79.4 kg)     Health Maintenance Due  Topic Date Due   TETANUS/TDAP  Never done   PAP SMEAR-Modifier  Never done   COLONOSCOPY (Pts 45-3yr Insurance coverage will need to be confirmed)  Never done   MAMMOGRAM  Never done   Zoster Vaccines- Shingrix (1 of 2) Never done   COVID-19 Vaccine (2 - Pfizer series) 07/05/2020   INFLUENZA VACCINE  12/13/2021    There are no preventive care reminders to display for this patient.  No results found for: "TSH" Lab Results  Component Value Date   WBC 10.5 05/24/2020   HGB 14.5 05/24/2020    HCT 42.2 05/24/2020   MCV 88.3 05/24/2020   PLT 351 05/24/2020   Lab Results  Component Value Date   NA 138 05/24/2020   K 3.0 (L) 05/24/2020  CO2 28 05/24/2020   GLUCOSE 110 (H) 05/24/2020   BUN 16 05/24/2020   CREATININE 0.56 05/24/2020   BILITOT 0.4 04/23/2015   ALKPHOS 50 04/23/2015   AST 22 04/23/2015   ALT 26 04/23/2015   PROT 8.2 (H) 04/23/2015   ALBUMIN 4.3 04/23/2015   CALCIUM 9.2 05/24/2020   ANIONGAP 14 05/24/2020   No results found for: "CHOL" No results found for: "HDL" No results found for: "LDLCALC" No results found for: "TRIG" No results found for: "CHOLHDL" No results found for: "HGBA1C"    Assessment & Plan:   Problem List Items Addressed This Visit   None   No orders of the defined types were placed in this encounter.   Follow-up: No follow-ups on file.    Eugenia Pancoast, FNP

## 2021-12-27 ENCOUNTER — Other Ambulatory Visit: Payer: Self-pay | Admitting: Family

## 2021-12-27 ENCOUNTER — Telehealth: Payer: Self-pay | Admitting: Family

## 2021-12-27 ENCOUNTER — Encounter: Payer: Self-pay | Admitting: Family

## 2021-12-27 ENCOUNTER — Other Ambulatory Visit (INDEPENDENT_AMBULATORY_CARE_PROVIDER_SITE_OTHER): Payer: No Typology Code available for payment source

## 2021-12-27 ENCOUNTER — Telehealth: Payer: Self-pay

## 2021-12-27 ENCOUNTER — Other Ambulatory Visit: Payer: Self-pay

## 2021-12-27 ENCOUNTER — Ambulatory Visit (INDEPENDENT_AMBULATORY_CARE_PROVIDER_SITE_OTHER): Payer: No Typology Code available for payment source | Admitting: Dermatology

## 2021-12-27 DIAGNOSIS — L814 Other melanin hyperpigmentation: Secondary | ICD-10-CM | POA: Diagnosis not present

## 2021-12-27 DIAGNOSIS — R7989 Other specified abnormal findings of blood chemistry: Secondary | ICD-10-CM | POA: Diagnosis not present

## 2021-12-27 DIAGNOSIS — L821 Other seborrheic keratosis: Secondary | ICD-10-CM

## 2021-12-27 DIAGNOSIS — N952 Postmenopausal atrophic vaginitis: Secondary | ICD-10-CM

## 2021-12-27 DIAGNOSIS — Z01419 Encounter for gynecological examination (general) (routine) without abnormal findings: Secondary | ICD-10-CM | POA: Insufficient documentation

## 2021-12-27 DIAGNOSIS — D2261 Melanocytic nevi of right upper limb, including shoulder: Secondary | ICD-10-CM | POA: Diagnosis not present

## 2021-12-27 DIAGNOSIS — D1800 Hemangioma unspecified site: Secondary | ICD-10-CM

## 2021-12-27 DIAGNOSIS — D229 Melanocytic nevi, unspecified: Secondary | ICD-10-CM

## 2021-12-27 DIAGNOSIS — T50Z95A Adverse effect of other vaccines and biological substances, initial encounter: Secondary | ICD-10-CM | POA: Diagnosis not present

## 2021-12-27 DIAGNOSIS — Z1211 Encounter for screening for malignant neoplasm of colon: Secondary | ICD-10-CM

## 2021-12-27 DIAGNOSIS — E7800 Pure hypercholesterolemia, unspecified: Secondary | ICD-10-CM

## 2021-12-27 LAB — CYTOLOGY - PAP
Comment: NEGATIVE
Diagnosis: NEGATIVE
High risk HPV: NEGATIVE

## 2021-12-27 LAB — T3, FREE: T3, Free: 2.7 pg/mL (ref 2.3–4.2)

## 2021-12-27 LAB — T4, FREE: Free T4: 0.66 ng/dL (ref 0.60–1.60)

## 2021-12-27 MED ORDER — PREMARIN 0.625 MG/GM VA CREA: TOPICAL_CREAM | VAGINAL | 12 refills | Status: DC

## 2021-12-27 MED ORDER — NA SULFATE-K SULFATE-MG SULF 17.5-3.13-1.6 GM/177ML PO SOLN: 354.0000 mL | Freq: Once | ORAL | 0 refills | Status: AC

## 2021-12-27 MED ORDER — ATORVASTATIN CALCIUM 10 MG PO TABS: 10.0000 mg | ORAL_TABLET | Freq: Every day | ORAL | 3 refills | Status: AC

## 2021-12-27 NOTE — Assessment & Plan Note (Signed)
Patient Counseling(The following topics were reviewed): ? Preventative care handout given to pt  ?Health maintenance and immunizations reviewed. Please refer to Health maintenance section. ?Pt advised on safe sex, wearing seatbelts in car, and proper nutrition ?labwork ordered today for annual ?Dental health: Discussed importance of regular tooth brushing, flossing, and dental visits. ? ? ?

## 2021-12-27 NOTE — Progress Notes (Signed)
   New Patient Visit  Subjective  Wanda Goodman is a 61 y.o. female who presents for the following: Skin Problem (Patient here today for a spot at left base of neck. Present for at least 10 years but PCP is concerned about it being so dark. No itching or bleeding. No personal or fhx of skin cancer. ).   The following portions of the chart were reviewed this encounter and updated as appropriate:   Tobacco  Allergies  Meds  Problems  Med Hx  Surg Hx  Fam Hx      Review of Systems:  No other skin or systemic complaints except as noted in HPI or Assessment and Plan.  Objective  Well appearing patient in no apparent distress; mood and affect are within normal limits.  A focused examination was performed including face, arms, hands, neck. Relevant physical exam findings are noted in the Assessment and Plan.  Right Upper Arm Pink symmetric plaque  Right Forearm White macule with central brown papule without features suspicious for malignancy on dermoscopy     Assessment & Plan  Adverse effect of vaccine, initial encounter Right Upper Arm  C/w arthus reaction. No evidence of infection today. Seek care for further spreading redness, fevers, chills.   Halo nevus Right Forearm  Will plan FBSE to assess for presence of other halo nevi or suspicious nevi  Consider biopsy on follow up if this is the only halo nevus. Patient to call with changes in the meantime.   Seborrheic Keratoses - Stuck-on, waxy, tan-brown papules and/or plaques  - Benign-appearing - Discussed benign etiology and prognosis. - Observe - Call for any changes  Lentigines - Scattered tan macules - Due to sun exposure - Benign-appering, observe - Recommend daily broad spectrum sunscreen SPF 30+ to sun-exposed areas, reapply every 2 hours as needed. - Call for any changes  Hemangiomas - Red papules - Discussed benign nature - Observe - Call for any changes  Melanocytic Nevi - Tan-brown and/or  pink-flesh-colored symmetric macules and papules - Benign appearing on exam today - Observation - Call clinic for new or changing moles - Recommend daily use of broad spectrum spf 30+ sunscreen to sun-exposed areas.   Return for TBSE.  Graciella Belton, RMA, am acting as scribe for Forest Gleason, MD .  Documentation: I have reviewed the above documentation for accuracy and completeness, and I agree with the above.  Forest Gleason, MD

## 2021-12-27 NOTE — Assessment & Plan Note (Signed)
Mammogram ordered. Needs to call to schedule.

## 2021-12-27 NOTE — Patient Instructions (Addendum)
Seborrheic Keratosis  What causes seborrheic keratoses? Seborrheic keratoses are harmless, common skin growths that first appear during adult life.  As time goes by, more growths appear.  Some people may develop a large number of them.  Seborrheic keratoses appear on both covered and uncovered body parts.  They are not caused by sunlight.  The tendency to develop seborrheic keratoses can be inherited.  They vary in color from skin-colored to gray, brown, or even black.  They can be either smooth or have a rough, warty surface.   Seborrheic keratoses are superficial and look as if they were stuck on the skin.  Under the microscope this type of keratosis looks like layers upon layers of skin.  That is why at times the top layer may seem to fall off, but the rest of the growth remains and re-grows.    Treatment Seborrheic keratoses do not need to be treated, but can easily be removed in the office.  Seborrheic keratoses often cause symptoms when they rub on clothing or jewelry.  Lesions can be in the way of shaving.  If they become inflamed, they can cause itching, soreness, or burning.  Removal of a seborrheic keratosis can be accomplished by freezing, burning, or surgery. If any spot bleeds, scabs, or grows rapidly, please return to have it checked, as these can be an indication of a skin cancer.  Recommend taking Heliocare sun protection supplement daily in sunny weather for additional sun protection. For maximum protection on the sunniest days, you can take up to 2 capsules of regular Heliocare OR take 1 capsule of Heliocare Ultra. For prolonged exposure (such as a full day in the sun), you can repeat your dose of the supplement 4 hours after your first dose. Heliocare can be purchased at [facility], at some [facility] or at www.heliocare.com.    Melanoma ABCDEs  Melanoma is the most dangerous type of skin cancer, and is the leading cause of death from skin disease.  You are more likely to  develop melanoma if you: Have light-colored skin, light-colored eyes, or red or blond hair Spend a lot of time in the sun Tan regularly, either outdoors or in a tanning bed Have had blistering sunburns, especially during childhood Have a close family member who has had a melanoma Have atypical moles or large birthmarks  Early detection of melanoma is key since treatment is typically straightforward and cure rates are extremely high if we catch it early.   The first sign of melanoma is often a change in a mole or a new dark spot.  The ABCDE system is a way of remembering the signs of melanoma.  A for asymmetry:  The two halves do not match. B for border:  The edges of the growth are irregular. C for color:  A mixture of colors are present instead of an even brown color. D for diameter:  Melanomas are usually (but not always) greater than 6mm - the size of a pencil eraser. E for evolution:  The spot keeps changing in size, shape, and color.  Please check your skin once per month between visits. You can use a small mirror in front and a large mirror behind you to keep an eye on the back side or your body.   If you see any new or changing lesions before your next follow-up, please call to schedule a visit.  Please continue daily skin protection including broad spectrum sunscreen SPF 30+ to sun-exposed areas, reapplying every 2   hours as needed when you're outdoors.    Due to recent changes in healthcare laws, you may see results of your pathology and/or laboratory studies on MyChart before the doctors have had a chance to review them. We understand that in some cases there may be results that are confusing or concerning to you. Please understand that not all results are received at the same time and often the doctors may need to interpret multiple results in order to provide you with the best plan of care or course of treatment. Therefore, we ask that you please give [place] 2 business days to  thoroughly review all your results before contacting the office for clarification. Should we see a critical lab result, you will be contacted sooner.   If You Need Anything After Your Visit  If you have any questions or concerns for your doctor, please call our main line at [phone] and press option 4 to reach your doctor's medical assistant. If no one answers, please leave a voicemail as directed and we will return your call as soon as possible. Messages left after 4 pm will be answered the following business day.   You may also send [place] a message via [place]. We typically respond to MyChart messages within 1-2 business days.  For prescription refills, please ask your pharmacy to contact our office. Our fax number is [phone].  If you have an urgent issue when the clinic is closed that cannot wait until the next business day, you can page your doctor at the number below.    Please note that while we do our best to be available for urgent issues outside of office hours, we are not available 24/7.   If you have an urgent issue and are unable to reach [place], you may choose to seek medical care at your doctor's office, retail clinic, urgent care center, or emergency room.  If you have a medical emergency, please immediately call 911 or go to the emergency department.  Pager Numbers  - Dr. Nehemiah Massed: [phone]  - Dr. Laurence Ferrari: [phone]  - Dr. Nicole Kindred: [phone]  In the event of inclement weather, please call our main line at [phone] for an update on the status of any delays or closures.  Dermatology Medication Tips: Please keep the boxes that topical medications come in in order to help keep track of the instructions about where and how to use these. Pharmacies typically print the medication instructions only on the boxes and not directly on the medication tubes.   If your medication is too expensive, please contact our office at [phone] option 4 or send [place] a message  through [place].   We are unable to tell what your co-pay for medications will be in advance as this is different depending on your insurance coverage. However, we may be able to find a substitute medication at lower cost or fill out paperwork to get insurance to cover a needed medication.   If a prior authorization is required to get your medication covered by your insurance company, please allow [place] 1-2 business days to complete this process.  Drug prices often vary depending on where the prescription is filled and some pharmacies may offer cheaper prices.  The website www.goodrx.com contains coupons for medications through different pharmacies. The prices here do not account for what the cost may be with help from insurance (it may be cheaper with your insurance), but the website can give you the price if you did not use any insurance.  -  print the associated coupon and take it with your prescription to the pharmacy.  - You may also stop by our office during regular business hours and pick up a GoodRx coupon card.  - If you need your prescription sent electronically to a different pharmacy, notify our office through [place] MyChart or by phone at [phone] option 4.     Si Usted Necesita Algo Despus de Su Visita  Tambin puede enviarnos un mensaje a travs de MyChart. Por lo general respondemos a los mensajes de MyChart en el transcurso de 1 a 2 das hbiles.  Para renovar recetas, por favor pida a su farmacia que se ponga en contacto con nuestra oficina. Nuestro nmero de fax es el [phone].  Si tiene un asunto urgente cuando la clnica est cerrada y que no puede esperar hasta el siguiente da hbil, puede llamar/localizar a su doctor(a) al nmero que aparece a continuacin.   Por favor, tenga en cuenta que aunque hacemos todo lo posible para estar disponibles para asuntos urgentes fuera del horario de oficina, no estamos disponibles las 24 horas del da, los 7 das de la  semana.   Si tiene un problema urgente y no puede comunicarse con nosotros, puede optar por buscar atencin mdica  en el consultorio de su doctor(a), en una clnica privada, en un centro de atencin urgente o en una sala de emergencias.  Si tiene una emergencia mdica, por favor llame inmediatamente al 911 o vaya a la sala de emergencias.  Nmeros de bper  - Dr. Kowalski: [phone]  - Dra. Moye: [phone]  - Dra. Stewart: [phone]  En caso de inclemencias del tiempo, por favor llame a nuestra lnea principal al [phone] para una actualizacin sobre el estado de cualquier retraso o cierre.  Consejos para la medicacin en dermatologa: Por favor, guarde las cajas en las que vienen los medicamentos de uso tpico para ayudarle a seguir las instrucciones sobre dnde y cmo usarlos. Las farmacias generalmente imprimen las instrucciones del medicamento slo en las cajas y no directamente en los tubos del medicamento.   Si su medicamento es muy caro, por favor, pngase en contacto con nuestra oficina llamando al [phone] y presione la opcin 4 o envenos un mensaje a travs de MyChart.   No podemos decirle cul ser su copago por los medicamentos por adelantado ya que esto es diferente dependiendo de la cobertura de su seguro. Sin embargo, es posible que podamos encontrar un medicamento sustituto a menor costo o llenar un formulario para que el seguro cubra el medicamento que se considera necesario.   Si se requiere una autorizacin previa para que su compaa de seguros cubra su medicamento, por favor permtanos de 1 a 2 das hbiles para completar este proceso.  Los precios de los medicamentos varan con frecuencia dependiendo del lugar de dnde se surte la receta y alguna farmacias pueden ofrecer precios ms baratos.  El sitio web www.goodrx.com tiene cupones para medicamentos de diferentes farmacias. Los precios aqu no tienen en cuenta lo que podra costar con la ayuda del  seguro (puede ser ms barato con su seguro), pero el sitio web puede darle el precio si no utiliz ningn seguro.  - Puede imprimir el cupn correspondiente y llevarlo con su receta a la farmacia.  - Tambin puede pasar por nuestra oficina durante el horario de atencin regular y recoger una tarjeta de cupones de GoodRx.  - Si necesita que su receta se enve electrnicamente a una farmacia diferente, informe a nuestra   a nuestra oficina a travs de MyChart de [place] o por telfono llamando al [phone] y presione la opcin 4.

## 2021-12-27 NOTE — Assessment & Plan Note (Signed)
Lipid panel ordered pending results.   

## 2021-12-27 NOTE — Assessment & Plan Note (Signed)
Bone density ordered already, pending pt to make appt

## 2021-12-27 NOTE — Progress Notes (Signed)
Pap negative however did see vaginal atrophy which was suspected due to sensitivity of the pap exam. Recommend premarin PV twice a week to see if pain improves.

## 2021-12-27 NOTE — Assessment & Plan Note (Signed)
Work on diet and exercise 

## 2021-12-27 NOTE — Telephone Encounter (Signed)
Gastroenterology Pre-Procedure Review  Request Date: 02/10/2022 Requesting Physician: Dr. Vicente Males  PATIENT REVIEW QUESTIONS: The patient responded to the following health history questions as indicated:    1. Are you having any GI issues? no 2. Do you have a personal history of Polyps? no 3. Do you have a family history of Colon Cancer or Polyps? no 4. Diabetes Mellitus? no 5. Joint replacements in the past 12 months?no 6. Major health problems in the past 3 months?no 7. Any artificial heart valves, MVP, or defibrillator?no    MEDICATIONS & ALLERGIES:    Patient reports the following regarding taking any anticoagulation/antiplatelet therapy:   Plavix, Coumadin, Eliquis, Xarelto, Lovenox, Pradaxa, Brilinta, or Effient? no Aspirin? no  Patient confirms/reports the following medications:  Current Outpatient Medications  Medication Sig Dispense Refill   conjugated estrogens (PREMARIN) vaginal cream Apply 1 applicator -ful PV twice weekly 42.5 g 12   albuterol (VENTOLIN HFA) 108 (90 Base) MCG/ACT inhaler Inhale 2 puffs into the lungs.     No current facility-administered medications for this visit.    Patient confirms/reports the following allergies:  No Known Allergies  No orders of the defined types were placed in this encounter.   AUTHORIZATION INFORMATION Primary Insurance: 1D#: Group #:  Secondary Insurance: 1D#: Group #:  SCHEDULE INFORMATION: Date:  Time: Location:

## 2021-12-27 NOTE — Assessment & Plan Note (Signed)
Not thrombosed however hard to touch with a growth on the external hemorrhoid, sending to Gi for possible banding.  Sitz baths prn.

## 2021-12-27 NOTE — Telephone Encounter (Signed)
Called and spoke to pt about lab results. 

## 2021-12-27 NOTE — Assessment & Plan Note (Signed)
Pt verbalized consent for pelvic exam Declines std testing Suspected vaginal atrophy upon presentation hpv thin prep ordered and pending results Pap exam in office completed, pt tolerated well.

## 2021-12-27 NOTE — Progress Notes (Signed)
Cholesterol much too elevated suggest daily lipitor, would pt be willing?  Ldl is 151, goal < 100. This increases risk for heart disease and stroke if untreated.   TERRI: CAN WE HAD FREE T3 AND FREE T4?

## 2021-12-27 NOTE — Assessment & Plan Note (Signed)
Referred to GI for colonoscopy 

## 2021-12-27 NOTE — Assessment & Plan Note (Addendum)
Work on diet and exercise  Referred to weight management

## 2021-12-27 NOTE — Telephone Encounter (Signed)
Pt returning call regarding test results . Would like a call back #[phone]

## 2021-12-27 NOTE — Assessment & Plan Note (Signed)
Td vaccine administered in office Pt tolerated procedure well  Verbal consent obtained prior to administration  Handout given in regards to vaccination.

## 2021-12-27 NOTE — Assessment & Plan Note (Signed)
Referral to dermatology.  

## 2021-12-29 ENCOUNTER — Encounter: Payer: Self-pay | Admitting: Family

## 2021-12-30 ENCOUNTER — Encounter: Payer: Self-pay | Admitting: Family

## 2021-12-30 ENCOUNTER — Other Ambulatory Visit: Payer: Self-pay | Admitting: Family

## 2021-12-30 DIAGNOSIS — N952 Postmenopausal atrophic vaginitis: Secondary | ICD-10-CM

## 2021-12-30 MED ORDER — ESTRADIOL 0.1 MG/GM VA CREA: 1.0000 | TOPICAL_CREAM | VAGINAL | 12 refills | Status: AC

## 2022-01-02 ENCOUNTER — Other Ambulatory Visit: Payer: Self-pay

## 2022-01-02 MED ORDER — NA SULFATE-K SULFATE-MG SULF 17.5-3.13-1.6 GM/177ML PO SOLN: 1.0000 | Freq: Once | ORAL | 0 refills | Status: AC

## 2022-01-02 NOTE — Progress Notes (Signed)
Pt request Suprep to be sent to [facility] on [address].  Rx sent.  Thanks,  [place], [place]

## 2022-01-04 ENCOUNTER — Encounter: Payer: Self-pay | Admitting: Family

## 2022-01-06 ENCOUNTER — Encounter: Payer: Self-pay | Admitting: Dermatology

## 2022-01-10 ENCOUNTER — Encounter: Payer: Self-pay | Admitting: Dermatology

## 2022-01-10 ENCOUNTER — Ambulatory Visit (INDEPENDENT_AMBULATORY_CARE_PROVIDER_SITE_OTHER): Payer: No Typology Code available for payment source | Admitting: Dermatology

## 2022-01-10 DIAGNOSIS — D2271 Melanocytic nevi of right lower limb, including hip: Secondary | ICD-10-CM

## 2022-01-10 DIAGNOSIS — L578 Other skin changes due to chronic exposure to nonionizing radiation: Secondary | ICD-10-CM

## 2022-01-10 DIAGNOSIS — D2261 Melanocytic nevi of right upper limb, including shoulder: Secondary | ICD-10-CM | POA: Diagnosis not present

## 2022-01-10 DIAGNOSIS — L814 Other melanin hyperpigmentation: Secondary | ICD-10-CM

## 2022-01-10 DIAGNOSIS — D492 Neoplasm of unspecified behavior of bone, soft tissue, and skin: Secondary | ICD-10-CM

## 2022-01-10 DIAGNOSIS — L80 Vitiligo: Secondary | ICD-10-CM | POA: Diagnosis not present

## 2022-01-10 DIAGNOSIS — Z1283 Encounter for screening for malignant neoplasm of skin: Secondary | ICD-10-CM

## 2022-01-10 DIAGNOSIS — B078 Other viral warts: Secondary | ICD-10-CM | POA: Diagnosis not present

## 2022-01-10 DIAGNOSIS — D1800 Hemangioma unspecified site: Secondary | ICD-10-CM

## 2022-01-10 DIAGNOSIS — D229 Melanocytic nevi, unspecified: Secondary | ICD-10-CM

## 2022-01-10 DIAGNOSIS — L821 Other seborrheic keratosis: Secondary | ICD-10-CM

## 2022-01-10 NOTE — Progress Notes (Signed)
Follow-Up Visit   Subjective  Wanda Goodman is a 61 y.o. female who presents for the following: Annual Exam (No personal or family hx of dysplastic nevi or skin cancer. Recheck possible halo nevus of right forearm. No changes per patient). The patient presents for Total-Body Skin Exam (TBSE) for skin cancer screening and mole check.  The patient has spots, moles and lesions to be evaluated, some may be new or changing and the patient has concerns that these could be cancer.  The following portions of the chart were reviewed this encounter and updated as appropriate:  Tobacco  Allergies  Meds  Problems  Med Hx  Surg Hx  Fam Hx      Review of Systems: No other skin or systemic complaints except as noted in HPI or Assessment and Plan.   Objective  Well appearing patient in no apparent distress; mood and affect are within normal limits.  A full examination was performed including scalp, head, eyes, ears, nose, lips, neck, chest, axillae, abdomen, back, buttocks, bilateral upper extremities, bilateral lower extremities, hands, feet, fingers, toes, fingernails, and toenails. All findings within normal limits unless otherwise noted below.  right lateral superior thigh, right dorsal forearm 0.8 cm thin brown papule, darker in center without features suspicious for malignancy on dermoscopy, with surrounding white macule at right thigh. Similar smaller area at right dorsal forearm.   right arm, right chest Few depigmented macules and patches at right arm and right chest  Mid Back 0.3 cm scaly pink papule      Assessment & Plan   Lentigines - Scattered tan macules - Due to sun exposure - Benign-appearing, observe - Recommend daily broad spectrum sunscreen SPF 30+ to sun-exposed areas, reapply every 2 hours as needed. - Call for any changes  Seborrheic Keratoses - Stuck-on, waxy, tan-brown papules and/or plaques  - Benign-appearing - Discussed benign etiology and prognosis. -  Observe - Call for any changes  Melanocytic Nevi - Tan-brown and/or pink-flesh-colored symmetric macules and papules - Benign appearing on exam today - Observation - Call clinic for new or changing moles - Recommend daily use of broad spectrum spf 30+ sunscreen to sun-exposed areas.   Hemangiomas - Red papules - Discussed benign nature - Observe - Call for any changes  Actinic Damage - Chronic condition, secondary to cumulative UV/sun exposure - diffuse scaly erythematous macules with underlying dyspigmentation - Recommend daily broad spectrum sunscreen SPF 30+ to sun-exposed areas, reapply every 2 hours as needed.  - Staying in the shade or wearing long sleeves, sun glasses (UVA+UVB protection) and wide brim hats (4-inch brim around the entire circumference of the hat) are also recommended for sun protection.  - Call for new or changing lesions.  Skin cancer screening performed today.  Halo nevus right lateral superior thigh, right dorsal forearm  Benign-appearing.  Observation.  Call clinic for new or changing lesions.  Recommend daily use of broad spectrum spf 30+ sunscreen to sun-exposed areas.    Vitiligo right arm, right chest  Patient deferred treatment at this time.  Vitiligo is a chronic autoimmune condition which causes loss of skin pigment and is commonly seen on the face and may also involve areas of trauma like hands, elbows, knees, and ankles. There is no cure and it is difficult to treat.  Treatments include topical steroids and other topical anti-inflammatory ointments/creams and topical and oral Jak inhibitors.  Sometimes narrow band UV light therapy or Xtrac laser is helpful, both of which require twice weekly  treatments for at least 3-6 months.  Antioxidant vitamins, such as Vitamins A,C,E,D, Folic Acid and B12 may be added to enhance treatment.    Neoplasm of skin Mid Back  Skin / nail biopsy Type of biopsy: tangential   Informed consent: discussed and  consent obtained   Anesthesia: the lesion was anesthetized in a standard fashion   Anesthesia comment:  Area prepped with alcohol Anesthetic:  1% lidocaine w/ epinephrine 1-100,000 buffered w/ 8.4% NaHCO3 Instrument used: flexible razor blade   Hemostasis achieved with: pressure, aluminum chloride and electrodesiccation   Outcome: patient tolerated procedure well   Post-procedure details: wound care instructions given   Post-procedure details comment:  Ointment and small bandage applied  Specimen 1 - Surgical pathology Differential Diagnosis: R/O SCC vs ISK vs other  Check Margins: No   Return in about 1 year (around 01/11/2023) for TBSE.  I, Emelia Salisbury, CMA, am acting as scribe for Forest Gleason, MD.  Documentation: I have reviewed the above documentation for accuracy and completeness, and I agree with the above.  Forest Gleason, MD

## 2022-01-10 NOTE — Patient Instructions (Addendum)
Wound Care Instructions  Cleanse wound gently with soap and water once a day then pat dry with clean gauze. Apply a thin coat of Petrolatum (petroleum jelly, "Vaseline") over the wound (unless you have an allergy to this). We recommend that you use a new, sterile tube of Vaseline. Do not pick or remove scabs. Do not remove the yellow or white "healing tissue" from the base of the wound.  Cover the wound with fresh, clean, nonstick gauze and secure with paper tape. You may use Band-Aids in place of gauze and tape if the wound is small enough, but would recommend trimming much of the tape off as there is often too much. Sometimes Band-Aids can irritate the skin.  You should call the office for your biopsy report after 1 week if you have not already been contacted.  If you experience any problems, such as abnormal amounts of bleeding, swelling, significant bruising, significant pain, or evidence of infection, please call the office immediately.  FOR ADULT SURGERY PATIENTS: If you need something for pain relief you may take 1 extra strength Tylenol (acetaminophen) AND 2 Ibuprofen (200mg each) together every 4 hours as needed for pain. (do not take these if you are allergic to them or if you have a reason you should not take them.) Typically, you may only need pain medication for 1 to 3 days.       Recommend taking Heliocare sun protection supplement daily in sunny weather for additional sun protection. For maximum protection on the sunniest days, you can take up to 2 capsules of regular Heliocare OR take 1 capsule of Heliocare Ultra. For prolonged exposure (such as a full day in the sun), you can repeat your dose of the supplement 4 hours after your first dose. Heliocare can be purchased at [facility], at some [facility] or at www.heliocare.com.     Recommend daily broad spectrum sunscreen SPF 30+ to sun-exposed areas, reapply every 2 hours as needed. Call for new or changing lesions.   Staying in the shade or wearing long sleeves, sun glasses (UVA+UVB protection) and wide brim hats (4-inch brim around the entire circumference of the hat) are also recommended for sun protection.    Melanoma ABCDEs  Melanoma is the most dangerous type of skin cancer, and is the leading cause of death from skin disease.  You are more likely to develop melanoma if you: Have light-colored skin, light-colored eyes, or red or blond hair Spend a lot of time in the sun Tan regularly, either outdoors or in a tanning bed Have had blistering sunburns, especially during childhood Have a close family member who has had a melanoma Have atypical moles or large birthmarks  Early detection of melanoma is key since treatment is typically straightforward and cure rates are extremely high if we catch it early.   The first sign of melanoma is often a change in a mole or a new dark spot.  The ABCDE system is a way of remembering the signs of melanoma.  A for asymmetry:  The two halves do not match. B for border:  The edges of the growth are irregular. C for color:  A mixture of colors are present instead of an even brown color. D for diameter:  Melanomas are usually (but not always) greater than 6mm - the size of a pencil eraser. E for evolution:  The spot keeps changing in size, shape, and color.  Please check your skin once per month between visits. You can use   a small mirror in front and a large mirror behind you to keep an eye on the back side or your body.   If you see any new or changing lesions before your next follow-up, please call to schedule a visit.  Please continue daily skin protection including broad spectrum sunscreen SPF 30+ to sun-exposed areas, reapplying every 2 hours as needed when you're outdoors.   Staying in the shade or wearing long sleeves, sun glasses (UVA+UVB protection) and wide brim hats (4-inch brim around the entire circumference of the hat) are also recommended for sun  protection.    Due to recent changes in healthcare laws, you may see results of your pathology and/or laboratory studies on MyChart before the doctors have had a chance to review them. We understand that in some cases there may be results that are confusing or concerning to you. Please understand that not all results are received at the same time and often the doctors may need to interpret multiple results in order to provide you with the best plan of care or course of treatment. Therefore, we ask that you please give us 2 business days to thoroughly review all your results before contacting the office for clarification. Should we see a critical lab result, you will be contacted sooner.   If You Need Anything After Your Visit  If you have any questions or concerns for your doctor, please call our main line at [phone] and press option 4 to reach your doctor's medical assistant. If no one answers, please leave a voicemail as directed and we will return your call as soon as possible. Messages left after 4 pm will be answered the following business day.   You may also send us a message via MyChart. We typically respond to MyChart messages within 1-2 business days.  For prescription refills, please ask your pharmacy to contact our office. Our fax number is [phone].  If you have an urgent issue when the clinic is closed that cannot wait until the next business day, you can page your doctor at the number below.    Please note that while we do our best to be available for urgent issues outside of office hours, we are not available 24/7.   If you have an urgent issue and are unable to reach us, you may choose to seek medical care at your doctor's office, retail clinic, urgent care center, or emergency room.  If you have a medical emergency, please immediately call 911 or go to the emergency department.  Pager Numbers  - Dr. Kowalski: [phone]  - Dr. Moye: [phone]  - Dr. Stewart:  [phone]  In the event of inclement weather, please call our main line at [phone] for an update on the status of any delays or closures.  Dermatology Medication Tips: Please keep the boxes that topical medications come in in order to help keep track of the instructions about where and how to use these. Pharmacies typically print the medication instructions only on the boxes and not directly on the medication tubes.   If your medication is too expensive, please contact our office at [phone] option 4 or send us a message through MyChart.   We are unable to tell what your co-pay for medications will be in advance as this is different depending on your insurance coverage. However, we may be able to find a substitute medication at lower cost or fill out paperwork to get insurance to cover a needed medication.   If a   prior authorization is required to get your medication covered by your insurance company, please allow us 1-2 business days to complete this process.  Drug prices often vary depending on where the prescription is filled and some pharmacies may offer cheaper prices.  The website www.goodrx.com contains coupons for medications through different pharmacies. The prices here do not account for what the cost may be with help from insurance (it may be cheaper with your insurance), but the website can give you the price if you did not use any insurance.  - You can print the associated coupon and take it with your prescription to the pharmacy.  - You may also stop by our office during regular business hours and pick up a GoodRx coupon card.  - If you need your prescription sent electronically to a different pharmacy, notify our office through [facility] MyChart or by phone at [phone] option 4.     Si Usted Necesita Algo Despus de Su Visita  Tambin puede enviarnos un mensaje a travs de MyChart. Por lo general respondemos a los mensajes de MyChart en el transcurso de 1 a 2  das hbiles.  Para renovar recetas, por favor pida a su farmacia que se ponga en contacto con nuestra oficina. Nuestro nmero de fax es el [phone].  Si tiene un asunto urgente cuando la clnica est cerrada y que no puede esperar hasta el siguiente da hbil, puede llamar/localizar a su doctor(a) al nmero que aparece a continuacin.   Por favor, tenga en cuenta que aunque hacemos todo lo posible para estar disponibles para asuntos urgentes fuera del horario de oficina, no estamos disponibles las 24 horas del da, los 7 das de la semana.   Si tiene un problema urgente y no puede comunicarse con nosotros, puede optar por buscar atencin mdica  en el consultorio de su doctor(a), en una clnica privada, en un centro de atencin urgente o en una sala de emergencias.  Si tiene una emergencia mdica, por favor llame inmediatamente al 911 o vaya a la sala de emergencias.  Nmeros de bper  - Dr. Kowalski: [phone]  - Dra. Moye: [phone]  - Dra. Stewart: [phone]  En caso de inclemencias del tiempo, por favor llame a nuestra lnea principal al [phone] para una actualizacin sobre el estado de cualquier retraso o cierre.  Consejos para la medicacin en dermatologa: Por favor, guarde las cajas en las que vienen los medicamentos de uso tpico para ayudarle a seguir las instrucciones sobre dnde y cmo usarlos. Las farmacias generalmente imprimen las instrucciones del medicamento slo en las cajas y no directamente en los tubos del medicamento.   Si su medicamento es muy caro, por favor, pngase en contacto con nuestra oficina llamando al [phone] y presione la opcin 4 o envenos un mensaje a travs de MyChart.   No podemos decirle cul ser su copago por los medicamentos por adelantado ya que esto es diferente dependiendo de la cobertura de su seguro. Sin embargo, es posible que podamos encontrar un medicamento sustituto a menor costo o llenar un formulario para que el  seguro cubra el medicamento que se considera necesario.   Si se requiere una autorizacin previa para que su compaa de seguros cubra su medicamento, por favor permtanos de 1 a 2 das hbiles para completar este proceso.  Los precios de los medicamentos varan con frecuencia dependiendo del lugar de dnde se surte la receta y alguna farmacias pueden ofrecer precios ms baratos.  El sitio web www.goodrx.com tiene cupones   para medicamentos de diferentes farmacias. Los precios aqu no tienen en cuenta lo que podra costar con la ayuda del seguro (puede ser ms barato con su seguro), pero el sitio web puede darle el precio si no utiliz ningn seguro.  - Puede imprimir el cupn correspondiente y llevarlo con su receta a la farmacia.  - Tambin puede pasar por nuestra oficina durante el horario de atencin regular y recoger una tarjeta de cupones de GoodRx.  - Si necesita que su receta se enve electrnicamente a una farmacia diferente, informe a nuestra oficina a travs de MyChart de [place] o por telfono llamando al [phone] y presione la opcin 4.  

## 2022-01-12 ENCOUNTER — Telehealth: Payer: Self-pay

## 2022-01-12 NOTE — Telephone Encounter (Signed)
-----   Message from Alfonso Patten, MD sent at 01/12/2022  1:30 PM EDT ----- Skin , mid back VERRUCA VULGARIS   This is a WART caused by the human papilloma virus. It is not dangerous but is contagious and can spread to other areas of skin or other people if it is not completely gone. No additional treatment is needed. However, if it comes back, we can freeze it in clinic with liquid nitrogen (a quick in office procedure) or you can also treat it at home with an over the counter salicylic wart treatment (slower).  Please call the office at [phone] or message [place] if you have have any questions.   MAs please call. Thank you!

## 2022-01-12 NOTE — Telephone Encounter (Signed)
Discussed pathology results. Patient voiced understanding. JP

## 2022-01-17 ENCOUNTER — Ambulatory Visit (INDEPENDENT_AMBULATORY_CARE_PROVIDER_SITE_OTHER): Payer: No Typology Code available for payment source | Admitting: Dermatology

## 2022-01-17 DIAGNOSIS — D485 Neoplasm of uncertain behavior of skin: Secondary | ICD-10-CM

## 2022-01-17 DIAGNOSIS — D492 Neoplasm of unspecified behavior of bone, soft tissue, and skin: Secondary | ICD-10-CM

## 2022-01-17 NOTE — Patient Instructions (Signed)
Wound Care Instructions  Cleanse wound gently with soap and water once a day then pat dry with clean gauze. Apply a thin coat of Petrolatum (petroleum jelly, "Vaseline") over the wound (unless you have an allergy to this). We recommend that you use a new, sterile tube of Vaseline. Do not pick or remove scabs. Do not remove the yellow or white "healing tissue" from the base of the wound.  Cover the wound with fresh, clean, nonstick gauze and secure with paper tape. You may use Band-Aids in place of gauze and tape if the wound is small enough, but would recommend trimming much of the tape off as there is often too much. Sometimes Band-Aids can irritate the skin.  You should call the office for your biopsy report after 1 week if you have not already been contacted.  If you experience any problems, such as abnormal amounts of bleeding, swelling, significant bruising, significant pain, or evidence of infection, please call the office immediately.  FOR ADULT SURGERY PATIENTS: If you need something for pain relief you may take 1 extra strength Tylenol (acetaminophen) AND 2 Ibuprofen (200mg each) together every 4 hours as needed for pain. (do not take these if you are allergic to them or if you have a reason you should not take them.) Typically, you may only need pain medication for 1 to 3 days.     Due to recent changes in healthcare laws, you may see results of your pathology and/or laboratory studies on MyChart before the doctors have had a chance to review them. We understand that in some cases there may be results that are confusing or concerning to you. Please understand that not all results are received at the same time and often the doctors may need to interpret multiple results in order to provide you with the best plan of care or course of treatment. Therefore, we ask that you please give us 2 business days to thoroughly review all your results before contacting the office for clarification. Should  we see a critical lab result, you will be contacted sooner.   If You Need Anything After Your Visit  If you have any questions or concerns for your doctor, please call our main line at [phone] and press option 4 to reach your doctor's medical assistant. If no one answers, please leave a voicemail as directed and we will return your call as soon as possible. Messages left after 4 pm will be answered the following business day.   You may also send us a message via MyChart. We typically respond to MyChart messages within 1-2 business days.  For prescription refills, please ask your pharmacy to contact our office. Our fax number is [phone].  If you have an urgent issue when the clinic is closed that cannot wait until the next business day, you can page your doctor at the number below.    Please note that while we do our best to be available for urgent issues outside of office hours, we are not available 24/7.   If you have an urgent issue and are unable to reach us, you may choose to seek medical care at your doctor's office, retail clinic, urgent care center, or emergency room.  If you have a medical emergency, please immediately call 911 or go to the emergency department.  Pager Numbers  - Dr. Kowalski: [phone]  - Dr. Moye: [phone]  - Dr. Stewart: [phone]  In the event of inclement weather, please call our main line at   [phone] for an update on the status of any delays or closures.  Dermatology Medication Tips: Please keep the boxes that topical medications come in in order to help keep track of the instructions about where and how to use these. Pharmacies typically print the medication instructions only on the boxes and not directly on the medication tubes.   If your medication is too expensive, please contact our office at [phone] option 4 or send us a message through MyChart.   We are unable to tell what your co-pay for medications will be in  advance as this is different depending on your insurance coverage. However, we may be able to find a substitute medication at lower cost or fill out paperwork to get insurance to cover a needed medication.   If a prior authorization is required to get your medication covered by your insurance company, please allow us 1-2 business days to complete this process.  Drug prices often vary depending on where the prescription is filled and some pharmacies may offer cheaper prices.  The website www.goodrx.com contains coupons for medications through different pharmacies. The prices here do not account for what the cost may be with help from insurance (it may be cheaper with your insurance), but the website can give you the price if you did not use any insurance.  - You can print the associated coupon and take it with your prescription to the pharmacy.  - You may also stop by our office during regular business hours and pick up a GoodRx coupon card.  - If you need your prescription sent electronically to a different pharmacy, notify our office through [place] MyChart or by phone at [phone] option 4.     Si Usted Necesita Algo Despus de Su Visita  Tambin puede enviarnos un mensaje a travs de MyChart. Por lo general respondemos a los mensajes de MyChart en el transcurso de 1 a 2 das hbiles.  Para renovar recetas, por favor pida a su farmacia que se ponga en contacto con nuestra oficina. Nuestro nmero de fax es el [phone].  Si tiene un asunto urgente cuando la clnica est cerrada y que no puede esperar hasta el siguiente da hbil, puede llamar/localizar a su doctor(a) al nmero que aparece a continuacin.   Por favor, tenga en cuenta que aunque hacemos todo lo posible para estar disponibles para asuntos urgentes fuera del horario de oficina, no estamos disponibles las 24 horas del da, los 7 das de la semana.   Si tiene un problema urgente y no puede comunicarse con nosotros, puede  optar por buscar atencin mdica  en el consultorio de su doctor(a), en una clnica privada, en un centro de atencin urgente o en una sala de emergencias.  Si tiene una emergencia mdica, por favor llame inmediatamente al 911 o vaya a la sala de emergencias.  Nmeros de bper  - Dr. Kowalski: [phone]  - Dra. Moye: [phone]  - Dra. Stewart: [phone]  En caso de inclemencias del tiempo, por favor llame a nuestra lnea principal al [phone] para una actualizacin sobre el estado de cualquier retraso o cierre.  Consejos para la medicacin en dermatologa: Por favor, guarde las cajas en las que vienen los medicamentos de uso tpico para ayudarle a seguir las instrucciones sobre dnde y cmo usarlos. Las farmacias generalmente imprimen las instrucciones del medicamento slo en las cajas y no directamente en los tubos del medicamento.   Si su medicamento es muy caro, por favor, pngase en contacto con   nuestra oficina llamando al [phone] y presione la opcin 4 o envenos un mensaje a travs de MyChart.   No podemos decirle cul ser su copago por los medicamentos por adelantado ya que esto es diferente dependiendo de la cobertura de su seguro. Sin embargo, es posible que podamos encontrar un medicamento sustituto a menor costo o llenar un formulario para que el seguro cubra el medicamento que se considera necesario.   Si se requiere una autorizacin previa para que su compaa de seguros cubra su medicamento, por favor permtanos de 1 a 2 das hbiles para completar este proceso.  Los precios de los medicamentos varan con frecuencia dependiendo del lugar de dnde se surte la receta y alguna farmacias pueden ofrecer precios ms baratos.  El sitio web www.goodrx.com tiene cupones para medicamentos de diferentes farmacias. Los precios aqu no tienen en cuenta lo que podra costar con la ayuda del seguro (puede ser ms barato con su seguro), pero el sitio web puede darle el  precio si no utiliz ningn seguro.  - Puede imprimir el cupn correspondiente y llevarlo con su receta a la farmacia.  - Tambin puede pasar por nuestra oficina durante el horario de atencin regular y recoger una tarjeta de cupones de GoodRx.  - Si necesita que su receta se enve electrnicamente a una farmacia diferente, informe a nuestra oficina a travs de MyChart de [place] o por telfono llamando al [phone] y presione la opcin 4.  

## 2022-01-17 NOTE — Progress Notes (Signed)
   Follow-Up Visit   Subjective  Wanda Goodman is a 61 y.o. female who presents for the following: Biopsies (Patient here today for shave removal of halo nevi. ).   The following portions of the chart were reviewed this encounter and updated as appropriate:   Tobacco  Allergies  Meds  Problems  Med Hx  Surg Hx  Fam Hx      Review of Systems:  No other skin or systemic complaints except as noted in HPI or Assessment and Plan.  Objective  Well appearing patient in no apparent distress; mood and affect are within normal limits.  A focused examination was performed including right arm, right thigh. Relevant physical exam findings are noted in the Assessment and Plan.  Right Dorsal Forearm 0.4 cm brown macule within white halo     Right Lateral Superior Thigh 0.8 cm thin brown papule, darker in center with faint surrounding halo         Assessment & Plan  Neoplasm of skin (2) Right Dorsal Forearm  Epidermal / dermal shaving  Lesion diameter (cm):  0.4 Informed consent: discussed and consent obtained   Timeout: patient name, date of birth, surgical site, and procedure verified   Anesthesia: the lesion was anesthetized in a standard fashion   Anesthetic:  1% lidocaine w/ epinephrine 1-100,000 local infiltration Instrument used: DermaBlade   Hemostasis achieved with: aluminum chloride   Outcome: patient tolerated procedure well   Post-procedure details: wound care instructions given   Additional details:  Mupirocin and a bandage applied  Specimen 1 - Surgical pathology Differential Diagnosis: r/o Atypia vs Halo Nevus  Check Margins: No 0.4 cm brown macule within white halo  Right Lateral Superior Thigh  Epidermal / dermal shaving  Lesion diameter (cm):  0.8 Informed consent: discussed and consent obtained   Timeout: patient name, date of birth, surgical site, and procedure verified   Anesthesia: the lesion was anesthetized in a standard fashion    Anesthetic:  1% lidocaine w/ epinephrine 1-100,000 local infiltration Instrument used: DermaBlade   Hemostasis achieved with: aluminum chloride   Outcome: patient tolerated procedure well   Post-procedure details: wound care instructions given   Additional details:  Mupirocin and a bandage applied  Specimen 2 - Surgical pathology Differential Diagnosis: r/o Atypia vs Halo Nevus  Check Margins: No 0.8 cm thin brown papule, darker in center with faint surrounding halo   Return for TBSE, as scheduled.  Graciella Belton, RMA, am acting as scribe for Forest Gleason, MD .   Documentation: I have reviewed the above documentation for accuracy and completeness, and I agree with the above.  Forest Gleason, MD

## 2022-01-18 ENCOUNTER — Telehealth: Payer: Self-pay

## 2022-01-18 NOTE — Telephone Encounter (Signed)
-----   Message from Alfonso Patten, MD sent at 01/18/2022  5:39 PM EDT ----- 1. Skin , right dorsal forearm LENTIGO    "Sun spot" --> no skin cancer seen, no additional treatment needed 2. Skin , right lateral superior thigh MELANOCYTIC NEVUS, INTRADERMAL TYPE, IRRITATED  This is a NORMAL MOLE. No skin cancer seen, no additional treatment is needed.   MAs please call. Thank you!

## 2022-01-18 NOTE — Telephone Encounter (Signed)
Patient advised bx results benign, no tx needed. Lurlean Horns., RMA

## 2022-01-29 ENCOUNTER — Encounter: Payer: Self-pay | Admitting: Dermatology

## 2022-02-06 ENCOUNTER — Encounter: Payer: Self-pay | Admitting: Family

## 2022-02-09 ENCOUNTER — Encounter: Payer: Self-pay | Admitting: Gastroenterology

## 2022-02-10 ENCOUNTER — Encounter: Payer: Self-pay | Admitting: Gastroenterology

## 2022-02-10 ENCOUNTER — Encounter
Admission: RE | Disposition: A | Discharge status: Home / Self Care | Payer: Self-pay | Source: Home / Self Care | Attending: Gastroenterology

## 2022-02-10 ENCOUNTER — Ambulatory Visit
Admission: RE | Admit: 2022-02-10 | Discharge: 2022-02-10 | Disposition: A | Discharge status: Home / Self Care | Payer: No Typology Code available for payment source | Attending: Gastroenterology | Admitting: Gastroenterology

## 2022-02-10 ENCOUNTER — Ambulatory Visit: Payer: No Typology Code available for payment source | Admitting: Certified Registered"

## 2022-02-10 DIAGNOSIS — J449 Chronic obstructive pulmonary disease, unspecified: Secondary | ICD-10-CM | POA: Diagnosis not present

## 2022-02-10 DIAGNOSIS — Z1211 Encounter for screening for malignant neoplasm of colon: Secondary | ICD-10-CM | POA: Insufficient documentation

## 2022-02-10 DIAGNOSIS — K5730 Diverticulosis of large intestine without perforation or abscess without bleeding: Secondary | ICD-10-CM | POA: Diagnosis not present

## 2022-02-10 DIAGNOSIS — Z79899 Other long term (current) drug therapy: Secondary | ICD-10-CM | POA: Insufficient documentation

## 2022-02-10 HISTORY — PX: COLONOSCOPY WITH PROPOFOL: SHX5780

## 2022-02-10 SURGERY — COLONOSCOPY WITH PROPOFOL
Anesthesia: General

## 2022-02-10 MED ORDER — STERILE WATER FOR IRRIGATION IR SOLN
Status: DC | PRN
  Administered 2022-02-10: 60 mL

## 2022-02-10 MED ORDER — PROPOFOL 500 MG/50ML IV EMUL
INTRAVENOUS | Status: DC | PRN
  Administered 2022-02-10: 150 ug/kg/min via INTRAVENOUS

## 2022-02-10 MED ORDER — PROPOFOL 10 MG/ML IV BOLUS
INTRAVENOUS | Status: DC | PRN
  Administered 2022-02-10: 90 mg via INTRAVENOUS

## 2022-02-10 MED ORDER — LIDOCAINE HCL (CARDIAC) PF 100 MG/5ML IV SOSY
PREFILLED_SYRINGE | INTRAVENOUS | Status: DC | PRN
  Administered 2022-02-10: 50 mg via INTRAVENOUS

## 2022-02-10 MED ORDER — SODIUM CHLORIDE 0.9 % IV SOLN: INTRAVENOUS | Status: DC

## 2022-02-10 NOTE — Anesthesia Procedure Notes (Signed)
Procedure Name: MAC Date/Time: 02/10/2022 9:54 AM  Performed by: Biagio Borg, CRNAPre-anesthesia Checklist: Patient identified, Emergency Drugs available, Suction available, Patient being monitored and Timeout performed Patient Re-evaluated:Patient Re-evaluated prior to induction Oxygen Delivery Method: Nasal cannula Induction Type: IV induction Placement Confirmation: positive ETCO2 and CO2 detector

## 2022-02-10 NOTE — Anesthesia Postprocedure Evaluation (Signed)
Anesthesia Post Note  Patient: Wanda Goodman  Procedure(s) Performed: COLONOSCOPY WITH PROPOFOL  Patient location during evaluation: Endoscopy Anesthesia Type: General Level of consciousness: awake and alert Pain management: pain level controlled Vital Signs Assessment: post-procedure vital signs reviewed and stable Respiratory status: spontaneous breathing, nonlabored ventilation, respiratory function stable and patient connected to nasal cannula oxygen Cardiovascular status: blood pressure returned to baseline and stable Postop Assessment: no apparent nausea or vomiting Anesthetic complications: no   No notable events documented.   Last Vitals:  Vitals:   02/10/22 1022 02/10/22 1032  BP: 137/79 (!) 143/86  Pulse: 77 66  Resp: 15 14  Temp:    SpO2: 98% 100%    Last Pain:  Vitals:   02/10/22 1032  TempSrc:   PainSc: 0-No pain                 Dimas Millin

## 2022-02-10 NOTE — H&P (Signed)
Wanda Bellows, MD [address] [place], [place], [place], [address] [address], [place], [place], [place], [address] Phone: [phone]  Fax: [phone]  Primary Care Physician:  Eugenia Pancoast, FNP   Pre-Procedure History & Physical: HPI:  Wanda Goodman is a 61 y.o. female is here for an colonoscopy.   Past Medical History:  Diagnosis Date   Cholelithiasis without cholecystitis 05/06/2015   COPD (chronic obstructive pulmonary disease) ([place])    Pneumonia 05-2014    Past Surgical History:  Procedure Laterality Date   CESAREAN SECTION     CHOLECYSTECTOMY N/A 05/25/2015   Procedure: LAPAROSCOPIC CHOLECYSTECTOMY;  Surgeon: Clayburn Pert, MD;  Location: [facility] ORS;  Service: General;  Laterality: N/A;    Prior to Admission medications   Medication Sig Start Date End Date Taking? Authorizing Provider  atorvastatin (LIPITOR) 10 MG tablet Take 1 tablet (10 mg total) by mouth daily. 12/27/21  Yes Dugal, Lawerance Bach, FNP  estradiol (ESTRACE VAGINAL) 0.1 MG/GM vaginal cream Place 1 Applicatorful vaginally 2 (two) times a week. 01/02/22  Yes Dugal, Lawerance Bach, FNP  albuterol (VENTOLIN HFA) 108 (90 Base) MCG/ACT inhaler Inhale 2 puffs into the lungs. 03/29/15   [provider]    Allergies as of 12/28/2021   (No Known Allergies)    Family History  Problem Relation Age of Onset   Hypertension Mother    COPD Mother    Heart disease Mother    COPD Brother     Social History   Socioeconomic History   Marital status: Married    Spouse name: Not on file   Number of children: 2   Years of education: Not on file   Highest education level: Not on file  Occupational History   Occupation: industrial [employer]    Comment: [employer]  Tobacco Use   Smoking status: Never    Passive exposure: Past   Smokeless tobacco: Never  Vaping Use   Vaping Use: Never used  Substance and Sexual Activity   Alcohol use: Yes    Alcohol/week: 1.0 standard drink of  alcohol    Types: 1 Glasses of wine per week    Comment: rarely   Drug use: No   Sexual activity: Yes    Partners: Male    Birth control/protection: Post-menopausal  Other Topics Concern   Not on file  Social History Narrative   ** Merged History Encounter **       Social Determinants of Health   Financial Resource Strain: Not on file  Food Insecurity: Not on file  Transportation Needs: Not on file  Physical Activity: Not on file  Stress: Not on file  Social Connections: Not on file  Intimate Partner Violence: Not on file    Review of Systems: See HPI, otherwise negative ROS  Physical Exam: BP (!) 173/96   Pulse 86   Temp (!) 96.5 F (35.8 C) (Temporal)   Resp 18   Ht '5\' 1"'$  (1.549 m)   Wt 85 kg   LMP 05/13/2013   SpO2 100%   BMI 35.41 kg/m  General:   Alert,  pleasant and cooperative in NAD Head:  Normocephalic and atraumatic. Neck:  Supple; no masses or thyromegaly. Lungs:  Clear throughout to auscultation, normal respiratory effort.    Heart:  +S1, +S2, Regular rate and rhythm, No edema. Abdomen:  Soft, nontender and nondistended. Normal bowel sounds, without guarding, and without rebound.   Neurologic:  Alert and  oriented x4;  grossly normal neurologically.  Impression/Plan: Wanda Goodman is here for an colonoscopy to be performed for Screening colonoscopy average risk   Risks, benefits, limitations, and alternatives regarding  colonoscopy have been reviewed with the patient.  Questions have been answered.  All parties agreeable.   Wanda Bellows, MD  02/10/2022, 9:41 AM

## 2022-02-10 NOTE — Op Note (Signed)
[facility]astroenterology Patient Name: Wanda Goodman Procedure Date: 02/10/2022 9:50 AM MRN: [id number] Account #: [id number] Date of Birth: 1960/08/30 Admit Type: Outpatient Age: 61 Room: [facility] ENDO ROOM 4 Gender: Female Note Status: Finalized Instrument Name: Jasper Riling [id number] Procedure:             Colonoscopy Indications:           Screening for colorectal malignant neoplasm Providers:             Jonathon Bellows MD, MD Referring MD:          Eugenia Pancoast (Referring MD) Medicines:             Monitored Anesthesia Care Complications:         No immediate complications. Procedure:             Pre-Anesthesia Assessment:                        - Prior to the procedure, a History and Physical was                         performed, and patient medications, allergies and                         sensitivities were reviewed. The patient's tolerance                         of previous anesthesia was reviewed.                        - The risks and benefits of the procedure and the                         sedation options and risks were discussed with the                         patient. All questions were answered and informed                         consent was obtained.                        - ASA Grade Assessment: II - A patient with mild                         systemic disease.                        After obtaining informed consent, the colonoscope was                         passed under direct vision. Throughout the procedure,                         the patient's blood pressure, pulse, and oxygen                         saturations were monitored continuously. The                         Colonoscope was introduced through  the anus and                         advanced to the the cecum, identified by the                         appendiceal orifice, IC valve and transillumination.                         The colonoscopy was performed with ease. The patient                          tolerated the procedure well. The quality of the bowel                         preparation was excellent. Findings:      The perianal and digital rectal examinations were normal.      Multiple small-mouthed diverticula were found in the sigmoid colon.      The exam was otherwise without abnormality on direct and retroflexion       views. Impression:            - Diverticulosis in the sigmoid colon.                        - The examination was otherwise normal on direct and                         retroflexion views.                        - No specimens collected. Recommendation:        - Discharge patient to home (with escort).                        - Resume previous diet.                        - Continue present medications.                        - Repeat colonoscopy in 10 years for screening                         purposes. Procedure Code(s):     --- Professional ---                        [phone], Colonoscopy, flexible; diagnostic, including                         collection of specimen(s) by brushing or washing, when                         performed (separate procedure) Diagnosis Code(s):     --- Professional ---                        Z12.11, Encounter for screening for malignant neoplasm                         of colon  K57.30, Diverticulosis of large intestine without                         perforation or abscess without bleeding CPT copyright 2019 [employer]. All rights reserved. The codes documented in this report are preliminary and upon coder review may  be revised to meet current compliance requirements. Jonathon Bellows, MD Jonathon Bellows MD, MD 02/10/2022 10:08:05 AM This report has been signed electronically. Number of Addenda: 0 Note Initiated On: 02/10/2022 9:50 AM Scope Withdrawal Time: 0 hours 8 minutes 56 seconds  Total Procedure Duration: 0 hours 12 minutes 30 seconds  Estimated Blood Loss:  Estimated blood  loss: none.      [facility]

## 2022-02-10 NOTE — Anesthesia Preprocedure Evaluation (Signed)
Anesthesia Evaluation  Patient identified by MRN, date of birth, ID band Patient awake    Reviewed: Allergy & Precautions, NPO status , Patient's Chart, lab work & pertinent test results  History of Anesthesia Complications Negative for: history of anesthetic complications  Airway Mallampati: III  TM Distance: >3 FB Neck ROM: full    Dental  (+) Dental Advidsory Given, Poor Dentition   Pulmonary neg shortness of breath, COPD,  COPD inhaler, neg recent URI,    Pulmonary exam normal        Cardiovascular (-) angina(-) Past MI and (-) CABG negative cardio ROS Normal cardiovascular exam     Neuro/Psych negative neurological ROS  negative psych ROS   GI/Hepatic negative GI ROS, Neg liver ROS,   Endo/Other  negative endocrine ROS  Renal/GU negative Renal ROS  negative genitourinary   Musculoskeletal   Abdominal   Peds  Hematology negative hematology ROS (+)   Anesthesia Other Findings Past Medical History: 05/06/2015: Cholelithiasis without cholecystitis No date: COPD (chronic obstructive pulmonary disease) ([place]) 05-2014: Pneumonia  Past Surgical History: No date: CESAREAN SECTION 05/25/2015: CHOLECYSTECTOMY; N/A     Comment:  Procedure: LAPAROSCOPIC CHOLECYSTECTOMY;  Surgeon:               Clayburn Pert, MD;  Location: [facility] ORS;  Service:               General;  Laterality: N/A;  BMI    Body Mass Index: 35.41 kg/m      Reproductive/Obstetrics negative OB ROS                             Anesthesia Physical Anesthesia Plan  ASA: 2  Anesthesia Plan: General   Post-op Pain Management: Minimal or no pain anticipated   Induction: Intravenous  PONV Risk Score and Plan: 3 and Propofol infusion, TIVA and Ondansetron  Airway Management Planned: Nasal Cannula  Additional Equipment: None  Intra-op Plan:   Post-operative Plan:   Informed Consent: I have reviewed the patients  History and Physical, chart, labs and discussed the procedure including the risks, benefits and alternatives for the proposed anesthesia with the patient or authorized representative who has indicated his/her understanding and acceptance.     Dental advisory given  Plan Discussed with: CRNA and Surgeon  Anesthesia Plan Comments: (Discussed risks of anesthesia with patient, including possibility of difficulty with spontaneous ventilation under anesthesia necessitating airway intervention, PONV, and rare risks such as cardiac or respiratory or neurological events, and allergic reactions. Discussed the role of CRNA in patient's perioperative care. Patient understands.)        Anesthesia Quick Evaluation

## 2022-02-10 NOTE — Transfer of Care (Signed)
Immediate Anesthesia Transfer of Care Note  Patient: Wanda Goodman  Procedure(s) Performed: COLONOSCOPY WITH PROPOFOL  Patient Location: PACU and Endoscopy Unit  Anesthesia Type:General  Level of Consciousness: drowsy  Airway & Oxygen Therapy: Patient Spontanous Breathing  Post-op Assessment: Report given to RN and Post -op Vital signs reviewed and stable  Post vital signs: Reviewed and stable  Last Vitals:  Vitals Value Taken Time  BP    Temp    Pulse 75 02/10/22 1011  Resp 16 02/10/22 1011  SpO2 100 % 02/10/22 1011  Vitals shown include unvalidated device data.  Last Pain:  Vitals:   02/10/22 0929  TempSrc: Temporal  PainSc: 0-No pain         Complications: No notable events documented.

## 2022-02-13 ENCOUNTER — Encounter: Payer: Self-pay | Admitting: Gastroenterology

## 2022-07-09 ENCOUNTER — Encounter: Payer: Self-pay | Admitting: Family

## 2023-01-31 ENCOUNTER — Encounter: Payer: No Typology Code available for payment source | Admitting: Dermatology

## 2023-02-06 ENCOUNTER — Encounter: Payer: No Typology Code available for payment source | Admitting: Dermatology
